# Patient Record
Sex: Female | Born: 1990 | Hispanic: No | Marital: Married | Smoking: Never smoker
Health system: Southern US, Community
[De-identification: ages and names within clinical notes are randomized; demographics above are authoritative.]

## PROBLEM LIST (undated history)

## (undated) ENCOUNTER — Inpatient Hospital Stay (HOSPITAL_COMMUNITY): Payer: Self-pay

## (undated) DIAGNOSIS — I809 Phlebitis and thrombophlebitis of unspecified site: Secondary | ICD-10-CM

## (undated) DIAGNOSIS — E282 Polycystic ovarian syndrome: Secondary | ICD-10-CM

## (undated) DIAGNOSIS — I82409 Acute embolism and thrombosis of unspecified deep veins of unspecified lower extremity: Secondary | ICD-10-CM

## (undated) DIAGNOSIS — K589 Irritable bowel syndrome without diarrhea: Secondary | ICD-10-CM

## (undated) DIAGNOSIS — D6859 Other primary thrombophilia: Secondary | ICD-10-CM

## (undated) HISTORY — PX: NO PAST SURGERIES: SHX2092

---
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
## 2014-11-08 NOTE — L&D Delivery Note (Addendum)
Delivery Note 2015: In room to assess.  Infant C/C/+2 station.  Patient aware of contractions.  Active pushing started and patient with good efforts.  Infant with some variable decelerations, during pushing, but overall reassuring.  Mother noted to have temperature of 100.1 and unasyn ordered and tylenol given.  Patient educated on shoulder dystocia maneuvers and extra personnel in room.  Dr. Carroll Sage updated and informed of potential shoulder dystocia and in hospital for delivery.  Patient delivered as below with staff and family support.    At 9:38 PM, on Sept 29, 2016, a viable female "Terri Mason" was delivered via Vaginal, Spontaneous Delivery (Presentation: Right Occiput Anterior with compound left hand and restitution to ROP).  Shoulders delivered easily and infant with good tone and spontaneous cry. Tactile stimulation given by provider and infant placed on mother's abdomen where nurse continued tactile stimulation.  Infant noted to have cord around the leg, by nurse.  However, infant APGAR: 9, 9. Cord clamped, cut, and blood collected. Placenta delivered spontaneously and noted to be intact with 3VC upon inspection.  Vaginal inspection revealed a deep 2nd degree laceration and bilateral labial lacerations.  Dr. Carroll Sage at bedside to confirm 2nd degree and repair made, by this provider, with 3-0 vicryl on a SH and CT-1 needled.  No additional anesthetic necessary and patient tolerated procedure well.  Fundus firm, at the umbilicus, and bleeding small.  Mother hemodynamically stable and infant skin to skin prior to provider exit.  Mother desire for birth control method not addressed, but she does opt to breastfeed.  Infant weight at one hour of life: 8lbs 12.7oz, 20.75in  Anesthesia: Epidural  Episiotomy: None Lacerations: 2nd degree;Perineal;Labial Suture Repair: 3.0 vicryl Est. Blood Loss (mL): 310  Mom to postpartum.  Baby to Couplet care / Skin to Skin.  EMLY, JESSICA LYNN MSN, CNM 08/06/2015,  10:50 PM

## 2015-01-03 LAB — OB RESULTS CONSOLE GC/CHLAMYDIA
Chlamydia: NEGATIVE
GC PROBE AMP, GENITAL: NEGATIVE

## 2015-01-03 LAB — OB RESULTS CONSOLE RUBELLA ANTIBODY, IGM: RUBELLA: IMMUNE

## 2015-01-03 LAB — OB RESULTS CONSOLE ABO/RH: RH Type: POSITIVE

## 2015-01-03 LAB — OB RESULTS CONSOLE HEPATITIS B SURFACE ANTIGEN: HEP B S AG: NEGATIVE

## 2015-01-03 LAB — OB RESULTS CONSOLE RPR: RPR: NONREACTIVE

## 2015-01-03 LAB — OB RESULTS CONSOLE HIV ANTIBODY (ROUTINE TESTING): HIV: NONREACTIVE

## 2015-01-03 LAB — OB RESULTS CONSOLE ANTIBODY SCREEN: ANTIBODY SCREEN: NEGATIVE

## 2015-07-09 LAB — OB RESULTS CONSOLE GBS: GBS: NEGATIVE

## 2015-08-05 ENCOUNTER — Inpatient Hospital Stay (HOSPITAL_COMMUNITY)
Admission: AD | Admit: 2015-08-05 | Discharge: 2015-08-08 | DRG: 775 | Disposition: A | Discharge status: Home / Self Care | Payer: Medicaid Other | Source: Ambulatory Visit | Attending: Obstetrics & Gynecology | Admitting: Obstetrics & Gynecology

## 2015-08-05 ENCOUNTER — Encounter (HOSPITAL_COMMUNITY): Payer: Self-pay

## 2015-08-05 DIAGNOSIS — O358XX0 Maternal care for other (suspected) fetal abnormality and damage, not applicable or unspecified: Secondary | ICD-10-CM | POA: Diagnosis present

## 2015-08-05 DIAGNOSIS — IMO0002 Reserved for concepts with insufficient information to code with codable children: Secondary | ICD-10-CM

## 2015-08-05 DIAGNOSIS — Z3A39 39 weeks gestation of pregnancy: Secondary | ICD-10-CM | POA: Diagnosis present

## 2015-08-05 DIAGNOSIS — O4292 Full-term premature rupture of membranes, unspecified as to length of time between rupture and onset of labor: Secondary | ICD-10-CM | POA: Diagnosis present

## 2015-08-05 DIAGNOSIS — O4290 Premature rupture of membranes, unspecified as to length of time between rupture and onset of labor, unspecified weeks of gestation: Secondary | ICD-10-CM | POA: Diagnosis present

## 2015-08-05 DIAGNOSIS — K589 Irritable bowel syndrome without diarrhea: Secondary | ICD-10-CM | POA: Diagnosis present

## 2015-08-05 DIAGNOSIS — O9962 Diseases of the digestive system complicating childbirth: Secondary | ICD-10-CM | POA: Diagnosis present

## 2015-08-05 LAB — CBC
HCT: 35.4 % — ABNORMAL LOW (ref 36.0–46.0)
HEMOGLOBIN: 11.9 g/dL — AB (ref 12.0–15.0)
MCH: 27.8 pg (ref 26.0–34.0)
MCHC: 33.6 g/dL (ref 30.0–36.0)
MCV: 82.7 fL (ref 78.0–100.0)
Platelets: 202 10*3/uL (ref 150–400)
RBC: 4.28 MIL/uL (ref 3.87–5.11)
RDW: 16.2 % — AB (ref 11.5–15.5)
WBC: 8.8 10*3/uL (ref 4.0–10.5)

## 2015-08-05 LAB — RAPID HIV SCREEN (HIV 1/2 AB+AG)
HIV 1/2 ANTIBODIES: NONREACTIVE
HIV-1 P24 Antigen - HIV24: NONREACTIVE

## 2015-08-05 LAB — TYPE AND SCREEN
ABO/RH(D): A POS
ANTIBODY SCREEN: NEGATIVE

## 2015-08-05 MED ORDER — OXYCODONE-ACETAMINOPHEN 5-325 MG PO TABS: 1.0000 | ORAL_TABLET | ORAL | Status: DC | PRN

## 2015-08-05 MED ORDER — LACTATED RINGERS IV SOLN
500.0000 mL | INTRAVENOUS | Status: DC | PRN
  Administered 2015-08-06 (×2): 500 mL via INTRAVENOUS

## 2015-08-05 MED ORDER — ACETAMINOPHEN 325 MG PO TABS: 650.0000 mg | ORAL_TABLET | ORAL | Status: DC | PRN

## 2015-08-05 MED ORDER — ZOLPIDEM TARTRATE 5 MG PO TABS: 5.0000 mg | ORAL_TABLET | Freq: Every evening | ORAL | Status: DC | PRN

## 2015-08-05 MED ORDER — OXYTOCIN BOLUS FROM INFUSION
500.0000 mL | INTRAVENOUS | Status: DC
  Administered 2015-08-06: 500 mL via INTRAVENOUS

## 2015-08-05 MED ORDER — FENTANYL CITRATE (PF) 100 MCG/2ML IJ SOLN: 50.0000 ug | INTRAMUSCULAR | Status: DC | PRN

## 2015-08-05 MED ORDER — LIDOCAINE HCL (PF) 1 % IJ SOLN
30.0000 mL | INTRAMUSCULAR | Status: DC | PRN
  Filled 2015-08-05: qty 30

## 2015-08-05 MED ORDER — OXYCODONE-ACETAMINOPHEN 5-325 MG PO TABS: 2.0000 | ORAL_TABLET | ORAL | Status: DC | PRN

## 2015-08-05 MED ORDER — CITRIC ACID-SODIUM CITRATE 334-500 MG/5ML PO SOLN
30.0000 mL | ORAL | Status: DC | PRN
  Administered 2015-08-06: 30 mL via ORAL
  Filled 2015-08-05: qty 15

## 2015-08-05 MED ORDER — LACTATED RINGERS IV SOLN
INTRAVENOUS | Status: DC
  Administered 2015-08-05 – 2015-08-06 (×3): via INTRAVENOUS

## 2015-08-05 MED ORDER — OXYTOCIN 40 UNITS IN LACTATED RINGERS INFUSION - SIMPLE MED: 62.5000 mL/h | INTRAVENOUS | Status: DC

## 2015-08-05 MED ORDER — ONDANSETRON HCL 4 MG/2ML IJ SOLN
4.0000 mg | Freq: Four times a day (QID) | INTRAMUSCULAR | Status: DC | PRN
  Administered 2015-08-06: 4 mg via INTRAVENOUS
  Filled 2015-08-05: qty 2

## 2015-08-05 NOTE — H&P (Signed)
Terri Mason is a 24 y.o. female, G1P0 at 39.4 weeks, presenting for SROM.  Patient states she has been leaking fluid since Saturday, but did not feel any contractions so did not seek assistance.  Patient states that signs of rupture were noted at her office visit today.  Patient desires waterbirth and is GBS negative.  Patient pregnancy significant for mild left side fetal pyelectasis.    Patient Active Problem List   Diagnosis Date Noted  . PROM (premature rupture of membranes) 08/05/2015  . Fetal pyelectasis 08/05/2015  . Irritable bowel syndrome 08/05/2015    History of present pregnancy: Patient entered care at 8.6 weeks at [facility] then transferred to [facility] at 18.4wks for WB option.   EDC of 08/08/2015 was established by 9 wk [place] on 01/03/2015.   Anatomy scan:  18.4 weeks, with normal findings and an anterior placenta.   Additional [place] evaluations:   -Anatomy: vertex, anterior placenta, no previa, Normal cord insertion seen. Normal fluid Anatomy seen=open hands, 5th digit, NB and female gender Anatomy not seen, RVOT, LVOL, AA and DA.   24wks FU: NORMAL ANATOMY US COMPLETION EXCEPT FOR LEFT PYELECTASIS 4.1 MM, 35.5wks: [place]: SIUP, VERTEX, NORMAL FLUID, GROWTH 84%, MILD PYLECTASIS ON THE LEFT Significant prenatal events:  2nd Trimester: C/O diarrhea and cramping after trip to [place].  Dx with viral syndrome by PCP.  Reports decreased fetal movement. C/O hemorrhoids that persisted through remainder of pregnancy.  3rd Trimester: C/o  Rash between breast and on upper abdomen. NST at 34.5 wks SHOWS NORMAL BASELINE, MOD. VAR, REACTIVE. TOCO: NO CONTRACTIONS.  c/o increased lower abdominal pain that causes difficulty walking and occasional sharp pains. Last evaluation:  08/05/2015 by S. Devane-Johnson, CNM.  VE Deferred.  FHR Not recorded. BP 96/78  Wt 157lbs  OB History    No data available     No past medical history on file. No past surgical history on file. Family History: family  history is not on file. Social History:  has no tobacco, alcohol, and drug history on file. Patient is married, husband name Production assistant, radio  Maternal Diabetes: No Genetic Screening: Declined Maternal Ultrasounds/Referrals: Abnormal:  Findings:   Fetal renal pyelectasis Fetal Ultrasounds or other Referrals:  None Maternal Substance Abuse:  No Significant Maternal Medications:  None Significant Maternal Lab Results: Lab values include: Group B Strep negative    ROS:  +LOF, +Ctx, +FM, -VB  Allergies not on file     Height  (1.549 m), weight 70.761 kg (156 lb).  Physical Exam  Constitutional: She is oriented to person, place, and time. She appears well-developed and well-nourished. No distress.  HENT:  Head: Normocephalic and atraumatic.  Eyes: EOM are normal. Pupils are equal, round, and reactive to light.  Neck: Normal range of motion.  Cardiovascular: Normal rate, regular rhythm and normal heart sounds.   Respiratory: Effort normal and breath sounds normal.  GI: Soft. Bowel sounds are normal.  Gravid--fundal height appears AGA, Soft, NT   Musculoskeletal: Normal range of motion. She exhibits edema (Pedial Bilateral +3).  Neurological: She is alert and oriented to person, place, and time.  Skin: Skin is warm and dry.  Red striation on lower abdomen     FHR: 145 bpm, Mod Var, -Decels, +Accels UCs:  Q2-65min, palpates mild  Prenatal labs: ABO, Rh:  A Positive Antibody:  Negative Rubella:    Immune RPR:   NR HBsAg:   Negative HIV:   Negative GBS:  Negative Sickle  cell/Hgb electrophoresis:  N/A Pap:  Normal 12/2014 GC:  Negative Chlamydia:  Negative Other:  Varicella Immune    Assessment IUP at 39.6wks Cat I FT Prolonged PROM GBS Negative Desires WB Fetal Pyelectasis  Plan: Admit to [facility] per consult with Dr. Carmela Hurt Routine Labor and Delivery Orders per [facility] Protocol In room to complete assessment and discuss  POC: -Restrictions to waterbirth including but not limited to maternal fever, pitocin augmentation, and BP -Considering potentially ruptured since Saturday, Saline lock necessary for quick access -Will consider intermittent monitoring -Will return to discuss expectant mgmt vs augmentation after cervical exam No questions or concerns   Nixon Sparr LYNNCNM, MSN 08/05/2015, 8:14 PM   Addendum 2220 SVE: 1/Th/-3 Vertex Discussed expectant mgmt vs augmentation Questions and concerns addressed Patient displays uncertainty regarding process Encouraged to consider options and ask questions as appropriate Given information regarding IV pain medication, sleep aides, and intermittent monitoring No further questions or concerns Dr. Sallyanne Havers updated  Marlene Bast MSN, CNM 08/05/2015 10:23 PM

## 2015-08-06 ENCOUNTER — Encounter (HOSPITAL_COMMUNITY): Payer: Self-pay

## 2015-08-06 ENCOUNTER — Inpatient Hospital Stay (HOSPITAL_COMMUNITY): Payer: Medicaid Other | Admitting: Anesthesiology

## 2015-08-06 LAB — ABO/RH: ABO/RH(D): A POS

## 2015-08-06 LAB — RPR: RPR Ser Ql: NONREACTIVE

## 2015-08-06 MED ORDER — ONDANSETRON HCL 4 MG/2ML IJ SOLN: 4.0000 mg | INTRAMUSCULAR | Status: DC | PRN

## 2015-08-06 MED ORDER — FENTANYL 2.5 MCG/ML BUPIVACAINE 1/10 % EPIDURAL INFUSION (WH - ANES)
14.0000 mL/h | INTRAMUSCULAR | Status: DC | PRN
  Administered 2015-08-06 (×2): 14 mL/h via EPIDURAL
  Filled 2015-08-06 (×2): qty 125

## 2015-08-06 MED ORDER — PHENYLEPHRINE 40 MCG/ML (10ML) SYRINGE FOR IV PUSH (FOR BLOOD PRESSURE SUPPORT)
80.0000 ug | PREFILLED_SYRINGE | INTRAVENOUS | Status: DC | PRN
  Filled 2015-08-06: qty 20

## 2015-08-06 MED ORDER — DIBUCAINE 1 % RE OINT
1.0000 "application " | TOPICAL_OINTMENT | RECTAL | Status: DC | PRN
  Administered 2015-08-07: 1 via RECTAL
  Filled 2015-08-06: qty 28

## 2015-08-06 MED ORDER — OXYCODONE-ACETAMINOPHEN 5-325 MG PO TABS: 2.0000 | ORAL_TABLET | ORAL | Status: DC | PRN

## 2015-08-06 MED ORDER — OXYTOCIN 40 UNITS IN LACTATED RINGERS INFUSION - SIMPLE MED
1.0000 m[IU]/min | INTRAVENOUS | Status: DC
  Filled 2015-08-06: qty 1000

## 2015-08-06 MED ORDER — IBUPROFEN 600 MG PO TABS
600.0000 mg | ORAL_TABLET | Freq: Four times a day (QID) | ORAL | Status: DC
  Administered 2015-08-07 – 2015-08-08 (×7): 600 mg via ORAL
  Filled 2015-08-06 (×7): qty 1

## 2015-08-06 MED ORDER — DIPHENHYDRAMINE HCL 25 MG PO CAPS: 25.0000 mg | ORAL_CAPSULE | Freq: Four times a day (QID) | ORAL | Status: DC | PRN

## 2015-08-06 MED ORDER — LANOLIN HYDROUS EX OINT: TOPICAL_OINTMENT | CUTANEOUS | Status: DC | PRN

## 2015-08-06 MED ORDER — ACETAMINOPHEN 325 MG PO TABS: 650.0000 mg | ORAL_TABLET | ORAL | Status: DC | PRN

## 2015-08-06 MED ORDER — LIDOCAINE HCL (PF) 1 % IJ SOLN
INTRAMUSCULAR | Status: DC | PRN
  Administered 2015-08-06 (×2): 5 mL via EPIDURAL

## 2015-08-06 MED ORDER — WITCH HAZEL-GLYCERIN EX PADS
1.0000 "application " | MEDICATED_PAD | CUTANEOUS | Status: DC | PRN
  Administered 2015-08-07: 1 via TOPICAL

## 2015-08-06 MED ORDER — PRENATAL MULTIVITAMIN CH
1.0000 | ORAL_TABLET | Freq: Every day | ORAL | Status: DC
  Administered 2015-08-07: 1 via ORAL
  Filled 2015-08-06: qty 1

## 2015-08-06 MED ORDER — ONDANSETRON HCL 4 MG PO TABS: 4.0000 mg | ORAL_TABLET | ORAL | Status: DC | PRN

## 2015-08-06 MED ORDER — TERBUTALINE SULFATE 1 MG/ML IJ SOLN
0.2500 mg | Freq: Once | INTRAMUSCULAR | Status: AC | PRN
  Administered 2015-08-06: 0.25 mg via SUBCUTANEOUS
  Filled 2015-08-06: qty 1

## 2015-08-06 MED ORDER — ACETAMINOPHEN 500 MG PO TABS
1000.0000 mg | ORAL_TABLET | Freq: Once | ORAL | Status: AC
  Administered 2015-08-06: 1000 mg via ORAL
  Filled 2015-08-06: qty 2

## 2015-08-06 MED ORDER — TETANUS-DIPHTH-ACELL PERTUSSIS 5-2.5-18.5 LF-MCG/0.5 IM SUSP: 0.5000 mL | Freq: Once | INTRAMUSCULAR | Status: DC

## 2015-08-06 MED ORDER — TERBUTALINE SULFATE 1 MG/ML IJ SOLN: 0.2500 mg | Freq: Once | INTRAMUSCULAR | Status: DC | PRN

## 2015-08-06 MED ORDER — SIMETHICONE 80 MG PO CHEW: 80.0000 mg | CHEWABLE_TABLET | ORAL | Status: DC | PRN

## 2015-08-06 MED ORDER — BENZOCAINE-MENTHOL 20-0.5 % EX AERO
1.0000 | INHALATION_SPRAY | CUTANEOUS | Status: DC | PRN
Start: 2015-08-06 — End: 2015-08-08
  Administered 2015-08-07: 1 via TOPICAL
  Filled 2015-08-06: qty 56

## 2015-08-06 MED ORDER — BUTORPHANOL TARTRATE 1 MG/ML IJ SOLN
1.0000 mg | INTRAMUSCULAR | Status: DC | PRN
  Administered 2015-08-06 (×2): 1 mg via INTRAVENOUS
  Filled 2015-08-06 (×2): qty 1

## 2015-08-06 MED ORDER — OXYCODONE-ACETAMINOPHEN 5-325 MG PO TABS: 1.0000 | ORAL_TABLET | ORAL | Status: DC | PRN

## 2015-08-06 MED ORDER — ZOLPIDEM TARTRATE 5 MG PO TABS: 5.0000 mg | ORAL_TABLET | Freq: Every evening | ORAL | Status: DC | PRN

## 2015-08-06 MED ORDER — SENNOSIDES-DOCUSATE SODIUM 8.6-50 MG PO TABS
2.0000 | ORAL_TABLET | ORAL | Status: DC
  Administered 2015-08-07 – 2015-08-08 (×2): 2 via ORAL
  Filled 2015-08-06 (×2): qty 2

## 2015-08-06 MED ORDER — DIPHENHYDRAMINE HCL 50 MG/ML IJ SOLN: 12.5000 mg | INTRAMUSCULAR | Status: DC | PRN

## 2015-08-06 MED ORDER — MISOPROSTOL 50MCG HALF TABLET
50.0000 ug | ORAL_TABLET | ORAL | Status: DC | PRN
  Administered 2015-08-06: 50 ug via ORAL
  Filled 2015-08-06: qty 0.5

## 2015-08-06 MED ORDER — EPHEDRINE 5 MG/ML INJ: 10.0000 mg | INTRAVENOUS | Status: DC | PRN

## 2015-08-06 MED ORDER — SODIUM CHLORIDE 0.9 % IV SOLN
3.0000 g | Freq: Four times a day (QID) | INTRAVENOUS | Status: DC
  Administered 2015-08-06: 3 g via INTRAVENOUS
  Filled 2015-08-06 (×4): qty 3

## 2015-08-06 NOTE — Anesthesia Preprocedure Evaluation (Signed)
Anesthesia Evaluation  Patient identified by MRN, date of birth, ID band Patient awake    Reviewed: Allergy & Precautions, NPO status , Patient's Chart, lab work & pertinent test results  Airway Mallampati: II  TM Distance: >3 FB Neck ROM: Full    Dental  (+) Teeth Intact   Pulmonary neg pulmonary ROS,    breath sounds clear to auscultation       Cardiovascular negative cardio ROS   Rhythm:Regular Rate:Normal     Neuro/Psych negative neurological ROS  negative psych ROS   GI/Hepatic negative GI ROS, Neg liver ROS,   Endo/Other  negative endocrine ROS  Renal/GU   negative genitourinary   Musculoskeletal   Abdominal   Peds negative pediatric ROS (+)  Hematology negative hematology ROS (+)   Anesthesia Other Findings   Reproductive/Obstetrics (+) Pregnancy                             Lab Results  Component Value Date   WBC 8.8 08/05/2015   HGB 11.9* 08/05/2015   HCT 35.4* 08/05/2015   MCV 82.7 08/05/2015   PLT 202 08/05/2015   No results found for: INR, PROTIME   Anesthesia Physical Anesthesia Plan  ASA: II  Anesthesia Plan: Epidural   Post-op Pain Management:    Induction:   Airway Management Planned:   Additional Equipment:   Intra-op Plan:   Post-operative Plan:   Informed Consent: I have reviewed the patients History and Physical, chart, labs and discussed the procedure including the risks, benefits and alternatives for the proposed anesthesia with the patient or authorized representative who has indicated his/her understanding and acceptance.     Plan Discussed with:   Anesthesia Plan Comments:         Anesthesia Quick Evaluation

## 2015-08-06 NOTE — Progress Notes (Addendum)
Labor Progress  Subjective: Pt comfortable with epidural.  Pt on the right side.  Foley in place draining to gravity clear yellow urine  Objective: BP 99/65 mmHg  Pulse 82  Temp(Src) 97.6 F (36.4 C) (Oral)  Resp 18  Ht  (1.549 m)  Wt 156 lb (70.761 kg)  BMI 29.49 kg/m2  SpO2 97%    FHT: 120, moderate variability, + accel, no decel CTX:  regular, every 5-6 minutes Uterus gravid, soft non tender SVE:  Dilation: 5 Effacement (%): 90 Station: -1 Exam by:: v standard cnm   Assessment:  IUP at 39.5 weeks NICHD: Category Membranes: SROM pt believes she ruptured Saturday 9/24 It was confirmed in the office on Tuesday 9/27 by Rocco Serene   Labor progress: Inadquate labor Asynclitic position  GBS: negative  Plan: Continue labor plan Continuous monitoring Rest Frequent position changes to facilitate fetal rotation and descent. Will reassess with cervical exam at 1600 or earlier if necessary Start pitocin per protocol      Venus Standard, CNM, MSN 08/06/2015. 12:44 PM

## 2015-08-06 NOTE — Progress Notes (Signed)
Labor Progress  Subjective: Comfortable, no complaints.  No urge to push  Objective: BP 118/74 mmHg  Pulse 88  Temp(Src) 97.5 F (36.4 C) (Oral)  Resp 18  Ht  (1.549 m)  Wt 156 lb (70.761 kg)  BMI 29.49 kg/m2  SpO2 97%   Total I/O In: -  Out: 500 [Urine:500] FHT: 1 CTX:  regular, every 3-5 minutes Uterus gravid, soft non tender SVE:  C/C/+1 Pitocin at 38mUn/min  Assessment:  IUP at 39.5 weeks NICHD: Category 1 Membranes:  SROM pt believes she ruptured Saturday 9/24 It was confirmed in the office on Tuesday 9/27 by Mattie Marlin CNM Labor progress: adquate labor Pitocin Augmentation GBS: negative  Plan: Continue labor plan Continuous monitoring Pt put in high fowlers to facilitate fetal descent Will reassess with cervical exam at 4:30 or earlier if necessary Continue pitocin per protocol    Venus Standard, CNM, MSN 08/06/2015. 3:40 PM

## 2015-08-06 NOTE — Progress Notes (Addendum)
Labor Progress  Subjective: Pushing started at 1610-9604, decel to 98 started at 1645.    Objective: BP 112/57 mmHg  Pulse 120  Temp(Src) 98.6 F (37 C) (Oral)  Resp 18  Ht  (1.549 m)  Wt 156 lb (70.761 kg)  BMI 29.49 kg/m2  SpO2 100%   Total I/O In: -  Out: 500 [Urine:500] FHT: 135, moderate variability, + accel, no decel since 1704. CTX:  regular, every 3-4 minutes, Uterus gravid, soft non tender SVE:  10cm at 0-+1 per Dr Molly Maduro   Assessment:  IUP at 39.5 weeks NICHD: Category 1 Category 3 with active intrauterine resuscitative measures starting at 1645-1700 Terb given per Dr Jearld Lesch Membranes: prolong with no s/s of infection Labor progress: stage 2 Pitocin DC per protocol GBS: Negative FSE and IUPC placed MVU 160    Plan: Continue labor plan Continuous monitoring Frequent position changes to facilitate fetal rotation and descent. Will reassess with cervical exam at 1830 or earlier if necessary Dr Su Hilt at the bedside.  Discussion of laboring down for 1 hour.  If no change will proceed with CS as other options were already discussed by CNM.  Pt is declining vacuum assistance.  The fetal head is too high to vacuum at this time.     Venus Standard, CNM, MSN 08/06/2015. 5:34 PM

## 2015-08-06 NOTE — Progress Notes (Signed)
Terri Mason MRN: [id number]  Subjective: -Patient resting in bed with peanut between legs.  Reports comfort and intermittent feeling of rectal pressure.  Patient expresses fear about events that took place during fetal bradycardia.  Family at bedside.  Objective: BP 125/75 mmHg  Pulse 116  Temp(Src) 98.6 F (37 C) (Oral)  Resp 20  Ht  (1.549 m)  Wt 70.761 kg (156 lb)  BMI 29.49 kg/m2  SpO2 100% I/O last 3 completed shifts: In: -  Out: 750 [Urine:750]   FHT: 135 bpm, Mod Var, -Decels, +Accels UC: Q2-40min, MVUs   SVE: Deferred Membranes: SROM  Pitocin:46mUn/min S/P Cytotec at 0230  Assessment:  IUP at 39.5wks Cat I FT  2nd Stage Labor Labor Augmentation Inadequate Contractions  Plan: -Sympathies given regarding fear during fetal bradycardia, Reassurances that infant is doing well currently -Discussed POC as implemented by Dr. Lance Morin and Dr. Carroll Sage *Allow time to labor down and for contractions to become adequate *Will reassess in one hour from last exam at 1830 per V. Standard, CNM report *If no significant change we our to will proceed with C/S *If change, but contractions inadequate will evaluate based on amount of fetal descent -Patient verbalizes understanding and agrees with plan -No questions, at current, from patient or husband -Continue other mgmt as ordered -Will update Dr. Carroll Sage accordingly  [facility], Terri Mason [facility], CNM 08/06/2015, 7:33 PM

## 2015-08-06 NOTE — Anesthesia Procedure Notes (Signed)
Epidural Patient location during procedure: OB Start time: 08/06/2015 11:00 AM End time: 08/06/2015 11:08 AM  Staffing Anesthesiologist: Shona Simpson D Performed by: anesthesiologist   Preanesthetic Checklist Completed: patient identified, site marked, surgical consent, pre-op evaluation, timeout performed, IV checked, risks and benefits discussed and monitors and equipment checked  Epidural Patient position: sitting Prep: ChloraPrep Patient monitoring: heart rate, continuous pulse ox and blood pressure Approach: midline Location: L4-L5 Injection technique: LOR saline  Needle:  Needle type: Tuohy  Needle gauge: 18 G Needle length: 9 cm and 9 Catheter type: closed end flexible Catheter size: 20 Guage Test dose: negative and 1.5% lidocaine with Epi 1:200 K  Assessment Events: blood not aspirated, injection not painful, no injection resistance, negative IV test and no paresthesia  Additional Notes LOR @ 5  Patient tolerated the insertion well without complications.Reason for block:procedure for pain

## 2015-08-06 NOTE — Progress Notes (Signed)
Labor Progress  Subjective: Pt very uncomfortable, report pain mainly in her back.  Has request IV pain medication x2.  Other pain management option reviewed, i.e., getting in the tub or an epidural. Family at the bedside for support  Objective: BP 102/66 mmHg  Pulse 82  Temp(Src) 97.8 F (36.6 C) (Oral)  Resp 18  Ht  (1.549 m)  Wt 156 lb (70.761 kg)  BMI 29.49 kg/m2     FHT: 120, moderate variability, + accel, no decels, maternal tracing with position change CTX:  regular, every 2-4 minutes Uterus gravid, soft non tender SVE:  Dilation: 4.5 Effacement (%): 90 Station: -1 Exam by:: Mason cnm   Assessment:  IUP at 39.5 weeks NICHD: Category Membranes:  SROM pt believes since last Saturday 9/24  It was confirmed in the office on Tuesday 9/27 by Mattie Marlin CNM.  No s/s of infection at this time Labor progress: adquate labor possibe Asynclitic position  GBS: negative    Plan: Continue labor plan Continuous monitoring IV Stadol q2 per request VE prior to Stadol Hands and knees or rrequent position changes to facilitate fetal rotation and descent. Will reassess with cervical exam at 1300 or earlier if necessary      Terri Mason, CNM, MSN 08/06/2015. 10:25 AM

## 2015-08-06 NOTE — Progress Notes (Addendum)
Terri Mason MRN: [id number]  Subjective: -Patient states she would like to implement augmentation process.  Reports contractions have slowed down, but she was having some scant bloody show.  Patient questions how long cervical ripening process will take.   Objective: BP 121/87 mmHg  Pulse 96  Temp(Src) 98.2 F (36.8 C) (Oral)  Resp 18  Ht  (1.549 m)  Wt 70.761 kg (156 lb)  BMI 29.49 kg/m2     FHT: 145 bpm, Mod Var, -Decels, +Accels at 2300 UC: Palpates mild SVE:   Dilation: 1 Effacement (%): Thick Station: -3 Exam by:: J.Emly, CNM  Vertex by [place] Membranes:SROM Pitocin:None Cytotec: 1st Dose at 0030  Assessment:  IUP at 39.5wks Cat I FT  Labor Augmentation GBS Negative  Plan: -Discussed r/b of cytotec including fetal distress and cramping/contractions -Informed that unsure how long process can take -Encouraged to rest -Will return at 0430 to reassess and perform cervical exam -No other questions or concerns -Continue other mgmt as ordered  EMLY, JESSICA LYNN,MSN, CNM 08/06/2015, 12:28 AM   Addendum 0100:   Nurse call states patient placed on monitor and is contracting every 3-4 minutes. Instructed to hold cytotec Will reassess at 0430 as originally planned  [facility], JESSICA LYNN

## 2015-08-07 LAB — CBC
HEMATOCRIT: 31.7 % — AB (ref 36.0–46.0)
HEMOGLOBIN: 10.4 g/dL — AB (ref 12.0–15.0)
MCH: 27.7 pg (ref 26.0–34.0)
MCHC: 32.8 g/dL (ref 30.0–36.0)
MCV: 84.3 fL (ref 78.0–100.0)
Platelets: 149 10*3/uL — ABNORMAL LOW (ref 150–400)
RBC: 3.76 MIL/uL — ABNORMAL LOW (ref 3.87–5.11)
RDW: 16.1 % — AB (ref 11.5–15.5)
WBC: 13.4 10*3/uL — ABNORMAL HIGH (ref 4.0–10.5)

## 2015-08-07 NOTE — Progress Notes (Signed)
One hour check delayed due to breastfeeding

## 2015-08-07 NOTE — Progress Notes (Signed)
Subjective: Postpartum Day 1: Vaginal delivery, 2nd degree perineal and bilateral labial lacerations Patient up ad lib, reports no syncope or dizziness. Feeding:  Breast Contraceptive plan:  Undecided  Plans outpatient circumcision.  Objective: Vital signs in last 24 hours: Temp:  [97.5 F (36.4 C)-100.1 F (37.8 C)] 98.4 F (36.9 C) (09/29 0515) Pulse Rate:  [71-122] 76 (09/29 0515) Resp:  [16-20] 18 (09/29 0515) BP: (93-134)/(49-83) 99/66 mmHg (09/29 0515) SpO2:  [96 %-100 %] 100 % (09/28 1700)  Physical Exam:  General: alert Lochia: appropriate Uterine Fundus: firm Perineum: healing well DVT Evaluation: No evidence of DVT seen on physical exam. Negative Homan's sign.   CBC Latest Ref Rng 08/07/2015 08/05/2015  WBC 4.0 - 10.5 K/uL 13.4(H) 8.8  Hemoglobin 12.0 - 15.0 g/dL 10.4(L) 11.9(L)  Hematocrit 36.0 - 46.0 % 31.7(L) 35.4(L)  Platelets 150 - 400 K/uL 149(L) 202     Assessment/Plan: Status post vaginal delivery day 1. Stable Continue current care. Plan for discharge tomorrow  Contraceptive options reviewed.    Nyra Capes 08/07/2015, 9:26 AM

## 2015-08-07 NOTE — Anesthesia Postprocedure Evaluation (Signed)
  Anesthesia Post-op Note  Patient: Chiropractor  Procedure(s) Performed: * No procedures listed *  Patient Location: Mother/Baby  Anesthesia Type:Epidural  Level of Consciousness: awake, alert  and oriented  Airway and Oxygen Therapy: Patient Spontanous Breathing  Post-op Pain: mild  Post-op Assessment: Post-op Vital signs reviewed, Patient's Cardiovascular Status Stable, Respiratory Function Stable, Patent Airway, No signs of Nausea or vomiting, Adequate PO intake, Pain level controlled and No headache              Post-op Vital Signs: Reviewed and stable  Last Vitals:  Filed Vitals:   08/07/15 0515  BP: 99/66  Pulse: 76  Temp: 36.9 C  Resp: 18    Complications: No apparent anesthesia complications

## 2015-08-07 NOTE — Discharge Summary (Signed)
  Vaginal Delivery Discharge Summary  Terri Mason  DOB:    1991-05-07 MRN:    [id number] CSN:    [id number]  Date of admission:                  08/05/15  Date of discharge:                   08/09/15  Procedures this admission:   SVB, repair of 2nd degree perineal and bilateral labial lacerationss  Date of Delivery: 08/06/15  Newborn Data:  Live born female  Birth Weight: 8 lb 12.7 oz (3990 g) APGAR: 9, 9  Home with mother. Name: Terri Mason Circumcision Plan: Outpatient  History of Present Illness:  [name] is a 24 y.o. female, G1P1001, who presents at [redacted]w[redacted]d weeks gestation. The patient has been followed at [facility]. She was admitted for onset of labor and rupture of membranes > 24 hours. Her pregnancy has been complicated by:  Patient Active Problem List   Diagnosis Date Noted  . SVD (spontaneous vaginal delivery) 08/06/2015  . Second-degree perineal laceration, with delivery 08/06/2015  . Obstetric labial laceration, delivered, current hospitalization 08/06/2015  . PROM (premature rupture of membranes) 08/05/2015  . Fetal pyelectasis 08/05/2015  . Irritable bowel syndrome 08/05/2015     Hospital Course:   Admitting Dx:  IUP at 39 5/7 weeks, prolonged ROM at term GBS Status:  Negative Delivering Clinician: Gerrit Heck, CNM Lacerations/MLE: 2nd degree perineal, bilateral labial Complications: None  Intrapartum Procedures: spontaneous vaginal delivery Postpartum Procedures: none   Additional Information:  Admitted with SROM likely > 24 hours, in early labor.  Initially desired waterbirth, then elected to proceed with epidural.  Pitocin for augmentation.    Discharge Diagnoses: Term Pregnancy-delivered, prolonged ROM  Feeding:  breast  Contraception:  no method--reports "took 4 years to get pregnant".  Issues reviewed.  Hemoglobin Results:  CBC Latest Ref Rng 08/07/2015 08/05/2015   WBC 4.0 - 10.5 K/uL 13.4(H) 8.8  Hemoglobin 12.0 - 15.0 g/dL 10.4(L) 11.9(L)  Hematocrit 36.0 - 46.0 % 31.7(L) 35.4(L)  Platelets 150 - 400 K/uL 149(L) 202     Discharge Physical Exam:   General: alert Lochia: appropriate Uterine Fundus: firm Incision: healing well DVT Evaluation: No evidence of DVT seen on physical exam. Negative Homan's sign.   Discharge Information:  Activity:           pelvic rest Diet:                routine Medications: Ibuprofen Condition:      stable Instructions:  Routine pp instructions, contraceptive options.   Discharge to: home  Follow-up Information    Follow up with [facility]. Schedule an appointment as soon as possible for a visit in 6 weeks.   Specialty:  Obstetrics and Gynecology   Why:  Call for any questions or concerns.   F/u with office regarding scheduling circumcision.   Contact information:   [address] [place] [address] [phone]       Nigel Bridgeman [facility] 08/08/2015 7:34 AM

## 2015-08-07 NOTE — Progress Notes (Signed)
UR chart review completed.  

## 2015-08-07 NOTE — Lactation Note (Signed)
This note was copied from the chart of Terri Saidy Defeo. Lactation Consultation Note: Lactation Brochure given to mother with review of baby and me book. Reviewed cue card with parents. Mother states she is active with WIC. She took all 3 WIC breastfeeding classes. Infant is 43 hours old. Mother describes that infant has not had a sustained latch. He suckles on and off with a shallow latch that pinches mothers nipple. Mother has attempt several times and states that infant is very sleepy. Mother has flat nipples. Rt nipple everts slightly more than left. Mothers rt nipple has become sore. Comfort gels given .   Informed mother of need to rouse infant with removing clothes and place infant skin to skin.  Infant placed at breast with several attempts. Infant only suckled once . Mother taught how to firm nipple with finger stimulation and a hand pump , but her nipple remains flat . Infant was fed 5-6 ml of ebm with a spoon. Attempt to latch again no sustain latch. Infant suckles on a gloved finger and has a strong suck. Infant has a high palate. Advised mother to hand express and supplement infant with a spoon every 2-3 hours. Discussed the use of a DEBP if infant doesn't feed well . Mother informed of cluster feeding. Advised to use nipple shield if unable to get infant latch. Suggested that she page for staff assistance for next feeding attempt. Mother receptive to all teaching.   Patient Name: Terri Mason [id number]'Q Date: 08/07/2015 Reason for consult: Initial assessment   Maternal Data Has patient been taught Hand Expression?: Yes Does the patient have breastfeeding experience prior to this delivery?: No  Feeding Feeding Type: Breast Fed Length of feed:  (no latch achieved, )  LATCH Score/Interventions Latch: Too sleepy or reluctant, no latch achieved, no sucking elicited. Intervention(s): Skin to skin;Teach feeding cues;Waking techniques Intervention(s): Adjust position;Assist with  latch;Breast compression  Audible Swallowing: None Intervention(s): Skin to skin;Hand expression  Type of Nipple: Flat Intervention(s): Hand pump  Comfort (Breast/Nipple): Soft / non-tender     Hold (Positioning): Assistance needed to correctly position infant at breast and maintain latch. Intervention(s): Support Pillows;Position options  LATCH Score: 4  Lactation Tools Discussed/Used Tools: Nipple Shields Nipple shield size: 20   Consult Status Consult Status: Follow-up Date: 08/08/15 Follow-up type: In-patient    Stevan Born [facility] 08/07/2015, 5:08 PM

## 2015-08-08 MED ORDER — IBUPROFEN 600 MG PO TABS: 600.0000 mg | ORAL_TABLET | Freq: Four times a day (QID) | ORAL | Status: DC | PRN

## 2015-08-08 NOTE — Lactation Note (Signed)
This note was copied from the chart of Terri Fidencia Macari. Lactation Consultation Note Follow up visit at 37 hours of age.  Mom is pumping on left breast, baby is not sustaining a latch.  Baby has been latching to right breast some.  Mom has been able to collect some colostrum and has finger fed with curve tip syringe.  Assisted with pump set up, gave mom a #21 flange for right breast to use as needed.  Encouraged mom to use preemie setting for 15 minutes and then work on hand expression to collect more EBM.  Instructed on cleaning supplies and storage of EBM.  Encouraged frequent feedings with feeding cues.  Assisted with Nipple shield application and mom is able to return demonstration, but will need continuous review.  Mom to use #20 on left breast and #16 on right as needed.  Assisted with latch in football hold on left breast with #20 nipple shield, baby sucks strong for just a minutes and then stops and sleeps.  Encouraged mom to keep trying, per mom last feeding was <2 hours ago.  Mom to call for assist as needed.    Patient Name: Terri Mason [id number] Date: 08/08/2015 Reason for consult: Follow-up assessment   Maternal Data    Feeding Feeding Type: Breast Fed Length of feed:  (few minutes baby not interested at this time)  LATCH Score/Interventions Latch: Repeated attempts needed to sustain latch, nipple held in mouth throughout feeding, stimulation needed to elicit sucking reflex. Intervention(s): Skin to skin;Teach feeding cues;Waking techniques Intervention(s): Breast compression;Breast massage;Assist with latch;Adjust position  Audible Swallowing: None  Type of Nipple: Flat  Comfort (Breast/Nipple): Soft / non-tender     Hold (Positioning): Assistance needed to correctly position infant at breast and maintain latch. Intervention(s): Breastfeeding basics reviewed;Support Pillows;Position options;Skin to skin  LATCH Score: 5  Lactation Tools Discussed/Used Nipple shield  size: 20   Consult Status Consult Status: Follow-up Date: 08/09/15 Follow-up type: In-patient    Shoptaw, Arvella Merles 08/08/2015, 11:34 AM

## 2015-08-08 NOTE — Discharge Instructions (Signed)
Postpartum Care After Vaginal Delivery °After you deliver your newborn (postpartum period), the usual stay in the hospital is 24-72 hours. If there were problems with your labor or delivery, or if you have other medical problems, you might be in the hospital longer.  °While you are in the hospital, you will receive help and instructions on how to care for yourself and your newborn during the postpartum period.  °While you are in the hospital: °· Be sure to tell your nurses if you have pain or discomfort, as well as where you feel the pain and what makes the pain worse. °· If you had an incision made near your vagina (episiotomy) or if you had some tearing during delivery, the nurses may put ice packs on your episiotomy or tear. The ice packs may help to reduce the pain and swelling. °· If you are breastfeeding, you may feel uncomfortable contractions of your uterus for a couple of weeks. This is normal. The contractions help your uterus get back to normal size. °· It is normal to have some bleeding after delivery. °· For the first 1-3 days after delivery, the flow is red and the amount may be similar to a period. °· It is common for the flow to start and stop. °· In the first few days, you may pass some small clots. Let your nurses know if you begin to pass large clots or your flow increases. °· Do not  flush blood clots down the toilet before having the nurse look at them. °· During the next 3-10 days after delivery, your flow should become more watery and pink or brown-tinged in color. °· Ten to fourteen days after delivery, your flow should be a small amount of yellowish-white discharge. °· The amount of your flow will decrease over the first few weeks after delivery. Your flow may stop in 6-8 weeks. Most women have had their flow stop by 12 weeks after delivery. °· You should change your sanitary pads frequently. °· Wash your hands thoroughly with soap and water for at least 20 seconds after changing pads, using  the toilet, or before holding or feeding your newborn. °· You should feel like you need to empty your bladder within the first 6-8 hours after delivery. °· In case you become weak, lightheaded, or faint, call your nurse before you get out of bed for the first time and before you take a shower for the first time. °· Within the first few days after delivery, your breasts may begin to feel tender and full. This is called engorgement. Breast tenderness usually goes away within 48-72 hours after engorgement occurs. You may also notice milk leaking from your breasts. If you are not breastfeeding, do not stimulate your breasts. Breast stimulation can make your breasts produce more milk. °· Spending as much time as possible with your newborn is very important. During this time, you and your newborn can feel close and get to know each other. Having your newborn stay in your room (rooming in) will help to strengthen the bond with your newborn.  It will give you time to get to know your newborn and become comfortable caring for your newborn. °· Your hormones change after delivery. Sometimes the hormone changes can temporarily cause you to feel sad or tearful. These feelings should not last more than a few days. If these feelings last longer than that, you should talk to your caregiver. °· If desired, talk to your caregiver about methods of family planning or contraception. °·   Talk to your caregiver about immunizations. Your caregiver may want you to have the following immunizations before leaving the hospital:  Tetanus, diphtheria, and pertussis (Tdap) or tetanus and diphtheria (Td) immunization. It is very important that you and your family (including grandparents) or others caring for your newborn are up-to-date with the Tdap or Td immunizations. The Tdap or Td immunization can help protect your newborn from getting ill.  Rubella immunization.  Varicella (chickenpox) immunization.  Influenza immunization. You should  receive this annual immunization if you did not receive the immunization during your pregnancy. Document Released: 08/22/2007 Document Revised: 07/19/2012 Document Reviewed: 06/21/2012 [facility] Patient Information 2015 [place], [place]. This information is not intended to replace advice given to you by your health care provider. Make sure you discuss any questions you have with your health care provider.  Contraception Choices Contraception (birth control) is the use of any methods or devices to prevent pregnancy. Below are some methods to help avoid pregnancy. HORMONAL METHODS   Contraceptive implant. This is a thin, plastic tube containing progesterone hormone. It does not contain estrogen hormone. Your health care provider inserts the tube in the inner part of the upper arm. The tube can remain in place for up to 3 years. After 3 years, the implant must be removed. The implant prevents the ovaries from releasing an egg (ovulation), thickens the cervical mucus to prevent sperm from entering the uterus, and thins the lining of the inside of the uterus.  Progesterone-only injections. These injections are given every 3 months by your health care provider to prevent pregnancy. This synthetic progesterone hormone stops the ovaries from releasing eggs. It also thickens cervical mucus and changes the uterine lining. This makes it harder for sperm to survive in the uterus.  Birth control pills. These pills contain estrogen and progesterone hormone. They work by preventing the ovaries from releasing eggs (ovulation). They also cause the cervical mucus to thicken, preventing the sperm from entering the uterus. Birth control pills are prescribed by a health care provider.Birth control pills can also be used to treat heavy periods.  Minipill. This type of birth control pill contains only the progesterone hormone. They are taken every day of each month and must be prescribed by your health care  provider.  Emergency contraception. Emergency contraceptives prevent pregnancy after unprotected sexual intercourse. This pill can be taken right after sex or up to 5 days after unprotected sex. It is most effective the sooner you take the pills after having sexual intercourse. Most emergency contraceptive pills are available without a prescription. Check with your pharmacist. Do not use emergency contraception as your only form of birth control. BARRIER METHODS   Female condom. This is a thin sheath (latex or rubber) that is worn over the penis during sexual intercourse. It can be used with spermicide to increase effectiveness.  Female condom. This is a soft, loose-fitting sheath that is put into the vagina before sexual intercourse.  Diaphragm. This is a soft, latex, dome-shaped barrier that must be fitted by a health care provider. It is inserted into the vagina, along with a spermicidal jelly. It is inserted before intercourse. The diaphragm should be left in the vagina for 6 to 8 hours after intercourse.  Cervical cap. This is a round, soft, latex or plastic cup that fits over the cervix and must be fitted by a health care provider. The cap can be left in place for up to 48 hours after intercourse.  Sponge. This is a soft, circular  piece of polyurethane foam. The sponge has spermicide in it. It is inserted into the vagina after wetting it and before sexual intercourse.  Spermicides. These are chemicals that kill or block sperm from entering the cervix and uterus. They come in the form of creams, jellies, suppositories, foam, or tablets. They do not require a prescription. They are inserted into the vagina with an applicator before having sexual intercourse. The process must be repeated every time you have sexual intercourse. INTRAUTERINE CONTRACEPTION  Intrauterine device (IUD). This is a T-shaped device that is put in a woman's uterus during a menstrual period to prevent pregnancy. There are 2  types:  Copper IUD. This type of IUD is wrapped in copper wire and is placed inside the uterus. Copper makes the uterus and fallopian tubes produce a fluid that kills sperm. It can stay in place for 10 years.  Hormone IUD. This type of IUD contains the hormone progestin (synthetic progesterone). The hormone thickens the cervical mucus and prevents sperm from entering the uterus, and it also thins the uterine lining to prevent implantation of a fertilized egg. The hormone can weaken or kill the sperm that get into the uterus. It can stay in place for 3-5 years, depending on which type of IUD is used. PERMANENT METHODS OF CONTRACEPTION  Female tubal ligation. This is when the woman's fallopian tubes are surgically sealed, tied, or blocked to prevent the egg from traveling to the uterus.  Hysteroscopic sterilization. This involves placing a small coil or insert into each fallopian tube. Your doctor uses a technique called hysteroscopy to do the procedure. The device causes scar tissue to form. This results in permanent blockage of the fallopian tubes, so the sperm cannot fertilize the egg. It takes about 3 months after the procedure for the tubes to become blocked. You must use another form of birth control for these 3 months.  Female sterilization. This is when the female has the tubes that carry sperm tied off (vasectomy).This blocks sperm from entering the vagina during sexual intercourse. After the procedure, the man can still ejaculate fluid (semen). NATURAL PLANNING METHODS  Natural family planning. This is not having sexual intercourse or using a barrier method (condom, diaphragm, cervical cap) on days the woman could become pregnant.  Calendar method. This is keeping track of the length of each menstrual cycle and identifying when you are fertile.  Ovulation method. This is avoiding sexual intercourse during ovulation.  Symptothermal method. This is avoiding sexual intercourse during ovulation,  using a thermometer and ovulation symptoms.  Post-ovulation method. This is timing sexual intercourse after you have ovulated. Regardless of which type or method of contraception you choose, it is important that you use condoms to protect against the transmission of sexually transmitted infections (STIs). Talk with your health care provider about which form of contraception is most appropriate for you. Document Released: 10/25/2005 Document Revised: 10/30/2013 Document Reviewed: 04/19/2013 [facility] Patient Information 2015 [place], [place]. This information is not intended to replace advice given to you by your health care provider. Make sure you discuss any questions you have with your health care provider.

## 2015-08-09 ENCOUNTER — Ambulatory Visit: Payer: Self-pay

## 2015-08-09 NOTE — Lactation Note (Signed)
This note was copied from the chart of Terri Berma Leamy. Lactation Consultation Note Mom had difficulty latching to breast d/t small short shaft nipples. Moms breast are filling w/transitional milk. Hand expression encouraged. Fitted mom w/#16 NS to Lt. Breast and in football hold latched baby w/o problem. Heard good swallows, say transfer of milk in NS after unlatched. Baby satisfied relaxed.  Assessed baby's suckle prior to latching, noted upper labial frenulum to bottom of top gum line. Tongue curled low when cried w/limited movement. Did note some milk leaking out of the corner of baby's mouth during suckling to breast w/NS.  Encouraged I&O, discussed positioning importance, transfering of milk, cluster feeding, engorgement, pumping, wearing nipple shields. Gave shells to wear between BF to assist in everting nipples. Reported to nursery RN to report to Dr. Manson Passey.  Mom has a plan and will call LC for follow-up appt. D/t wearing NS. Patient Name: Terri Mason WUJWJ'X Date: 08/09/2015 Reason for consult: Follow-up assessment   Maternal Data    Feeding Feeding Type: Breast Fed Length of feed: 15 min  LATCH Score/Interventions Latch: Grasps breast easily, tongue down, lips flanged, rhythmical sucking. Intervention(s): Waking techniques Intervention(s): Assist with latch;Adjust position;Breast massage;Breast compression  Audible Swallowing: Spontaneous and intermittent  Type of Nipple: Everted at rest and after stimulation (small short shaft nipples) Intervention(s): Shells;Double electric pump  Comfort (Breast/Nipple): Filling, red/small blisters or bruises, mild/mod discomfort  Problem noted: Mild/Moderate discomfort Interventions (Mild/moderate discomfort): Breast shields;Comfort gels;Hand massage;Hand expression;Post-pump  Hold (Positioning): Assistance needed to correctly position infant at breast and maintain latch. Intervention(s): Breastfeeding basics reviewed;Support  Pillows;Position options;Skin to skin  LATCH Score: 8  Lactation Tools Discussed/Used Tools: Shells;Nipple Shields;Pump;Comfort gels Nipple shield size: 16 Shell Type: Inverted Breast pump type: Double-Electric Breast Pump Pump Review: Milk Storage   Consult Status Consult Status: Complete Date: 08/09/15    Charyl Dancer 08/09/2015, 12:00 PM

## 2016-06-28 ENCOUNTER — Encounter: Payer: Self-pay | Admitting: Obstetrics & Gynecology

## 2016-10-27 ENCOUNTER — Emergency Department (HOSPITAL_BASED_OUTPATIENT_CLINIC_OR_DEPARTMENT_OTHER)
Admission: EM | Admit: 2016-10-27 | Discharge: 2016-10-27 | Disposition: A | Discharge status: Home / Self Care | Payer: 59 | Attending: Emergency Medicine | Admitting: Emergency Medicine

## 2016-10-27 ENCOUNTER — Other Ambulatory Visit: Payer: Self-pay | Admitting: *Deleted

## 2016-10-27 ENCOUNTER — Encounter (HOSPITAL_BASED_OUTPATIENT_CLINIC_OR_DEPARTMENT_OTHER): Payer: Self-pay

## 2016-10-27 DIAGNOSIS — R002 Palpitations: Secondary | ICD-10-CM

## 2016-10-27 HISTORY — DX: Acute embolism and thrombosis of unspecified deep veins of unspecified lower extremity: I82.409

## 2016-10-27 HISTORY — DX: Polycystic ovarian syndrome: E28.2

## 2016-10-27 LAB — URINALYSIS, ROUTINE W REFLEX MICROSCOPIC
Bilirubin Urine: NEGATIVE
GLUCOSE, UA: NEGATIVE mg/dL
HGB URINE DIPSTICK: NEGATIVE
KETONES UR: 15 mg/dL — AB
Leukocytes, UA: NEGATIVE
Nitrite: NEGATIVE
PROTEIN: NEGATIVE mg/dL
Specific Gravity, Urine: 1.029 (ref 1.005–1.030)
pH: 6 (ref 5.0–8.0)

## 2016-10-27 LAB — PREGNANCY, URINE: PREG TEST UR: NEGATIVE

## 2016-10-27 NOTE — ED Provider Notes (Signed)
[facility]   CSN: [id number] Arrival date & time: 10/27/16  1156     History   Chief Complaint Chief Complaint  Patient presents with  . Tachycardia    HPI Terri Mason is a 25 y.o. female.  This is patients 3rd visit to an emergency room and has had one visit to her primary doctor regarding palpitations. She has a flu like illness going on the last few days and she's had intermittent seemingly random episodes of palpitations with that as well. Her mother had a history of having add supraventricular tachycardia for which she needed an ablation So this has her worry. She gets this potations randomly. She has it she's really short of breath and then she gets very concerned and progressively worse and  she feels that she cannot breathe. Just some chest pressure during the time but no other symptoms. She does not take drugs, alcohol or tobacco. No change in her caffeine. No medication changes. She saw her doctor earlier today and set up for her to get a Holter monitor but she's not gotten it yet.      Past Medical History:  Diagnosis Date  . DVT (deep venous thrombosis) (HCC)   . PCOS (polycystic ovarian syndrome)     Patient Active Problem List   Diagnosis Date Noted  . SVD (spontaneous vaginal delivery) 08/06/2015  . Second-degree perineal laceration, with delivery 08/06/2015  . Obstetric labial laceration, delivered, current hospitalization 08/06/2015  . PROM (premature rupture of membranes) 08/05/2015  . Fetal pyelectasis 08/05/2015  . Irritable bowel syndrome 08/05/2015    History reviewed. No pertinent surgical history.  OB History    Gravida Para Term Preterm AB Living   1 1 1     1    SAB TAB Ectopic Multiple Live Births         0 1       Home Medications    Prior to Admission medications   Medication Sig Start Date End Date Taking? Authorizing Provider  rivaroxaban (XARELTO) 10 MG TABS tablet Take 10 mg by mouth daily.   Yes  Historical Provider, MD    Family History No family history on file.  Social History Social History  Substance Use Topics  . Smoking status: Never Smoker  . Smokeless tobacco: Never Used  . Alcohol use No     Allergies   Patient has no known allergies.   Review of Systems Review of Systems  All other systems reviewed and are negative.    Physical Exam Updated Vital Signs BP 103/68   Pulse 74   Temp 97.7 F (36.5 C) (Oral)   Resp 15   Ht 5\' 1"  (1.549 m)   Wt 109 lb (49.4 kg)   LMP  (LMP Unknown)   SpO2 99%   BMI 20.60 kg/m   Physical Exam  Constitutional: She appears well-developed and well-nourished.  HENT:  Head: Normocephalic and atraumatic.  Eyes: Conjunctivae and EOM are normal.  Neck: Normal range of motion.  Cardiovascular: Normal rate, normal heart sounds and intact distal pulses.  An irregular rhythm present. Exam reveals no friction rub.   No murmur heard. Sinus arrhythmia  Pulmonary/Chest: Effort normal and breath sounds normal. No stridor. No respiratory distress.  Abdominal: Soft. She exhibits no distension.  Musculoskeletal: Normal range of motion. She exhibits no edema or deformity.  Neurological: She is alert.  Skin: Skin is warm and dry.  Nursing note and vitals reviewed.    ED Treatments /  Results  Labs (all labs ordered are listed, but only abnormal results are displayed) Labs Reviewed  URINALYSIS, ROUTINE W REFLEX MICROSCOPIC - Abnormal; Notable for the following:       Result Value   Color, Urine AMBER (*)    Ketones, ur 15 (*)    All other components within normal limits  PREGNANCY, URINE    EKG  EKG Interpretation  Date/Time:  Wednesday October 27 2016 12:11:45 EST Ventricular Rate:  64 PR Interval:  114 QRS Duration: 78 QT Interval:  428 QTC Calculation: 441 R Axis:   87 Text Interpretation:  Normal sinus rhythm Normal ECG No significant change since last tracing Confirmed by [facility], Barbara CowerJASON [phone]) on 10/27/2016  12:14:10 PM Also confirmed by [facility], Leoda Smithhart [phone]), editor [place] CT, Jola BabinskiMarilyn [phone])  on 10/27/2016 1:36:47 PM       Radiology No results found.  Procedures Procedures (including critical care time)  Medications Ordered in ED Medications - No data to display   Initial Impression / Assessment and Plan / ED Course  I have reviewed the triage vital signs and the nursing notes.  Pertinent labs & imaging results that were available during my care of the patient were reviewed by me and considered in my medical decision making (see chart for details).  Clinical Course     Here with palpitations. On exam has a sinus arrhythmia on the monitor but no other obvious abnormalities. Possibly anxiety/panic attacks vs intermittent SVT, less likely vtach/vfib. Reviewed labs from yesterday, no repeat necessary. I called cardiology office and she will get a holter monitor tomorrow.   Final Clinical Impressions(s) / ED Diagnoses   Final diagnoses:  Palpitations    New Prescriptions Discharge Medication List as of 10/27/2016  2:54 PM       Marily MemosJason Zahniya Zellars, MD 10/28/16 [phone]

## 2016-10-27 NOTE — ED Notes (Signed)
Pt verbalizes understanding of f/u care

## 2016-10-27 NOTE — ED Triage Notes (Addendum)
C/o "heart racing" x 3 days[facility]-here today-pt NAD-steady gait

## 2016-10-27 NOTE — ED Notes (Signed)
ED Provider at bedside. 

## 2016-10-27 NOTE — Discharge Instructions (Signed)
Cardiology will call you with an appointment, likely tomorrow afternoon.

## 2016-10-28 ENCOUNTER — Ambulatory Visit (INDEPENDENT_AMBULATORY_CARE_PROVIDER_SITE_OTHER): Payer: PRIVATE HEALTH INSURANCE

## 2016-10-28 DIAGNOSIS — R002 Palpitations: Secondary | ICD-10-CM

## 2018-07-25 ENCOUNTER — Other Ambulatory Visit: Payer: Self-pay

## 2018-08-28 ENCOUNTER — Encounter (HOSPITAL_COMMUNITY): Payer: Self-pay

## 2018-10-17 ENCOUNTER — Encounter (HOSPITAL_COMMUNITY): Payer: Self-pay | Admitting: *Deleted

## 2018-10-18 ENCOUNTER — Ambulatory Visit (HOSPITAL_COMMUNITY)
Admission: RE | Admit: 2018-10-18 | Discharge: 2018-10-18 | Disposition: A | Discharge status: Home / Self Care | Payer: Medicaid Other | Source: Ambulatory Visit | Attending: Obstetrics and Gynecology | Admitting: Obstetrics and Gynecology

## 2018-10-18 ENCOUNTER — Encounter (HOSPITAL_COMMUNITY): Payer: Self-pay

## 2018-10-18 DIAGNOSIS — D6859 Other primary thrombophilia: Secondary | ICD-10-CM

## 2018-10-18 DIAGNOSIS — O99119 Other diseases of the blood and blood-forming organs and certain disorders involving the immune mechanism complicating pregnancy, unspecified trimester: Secondary | ICD-10-CM

## 2018-10-18 DIAGNOSIS — Z3A26 26 weeks gestation of pregnancy: Secondary | ICD-10-CM | POA: Insufficient documentation

## 2018-10-18 DIAGNOSIS — O99112 Other diseases of the blood and blood-forming organs and certain disorders involving the immune mechanism complicating pregnancy, second trimester: Secondary | ICD-10-CM | POA: Diagnosis not present

## 2018-10-18 HISTORY — DX: Phlebitis and thrombophlebitis of unspecified site: I80.9

## 2018-10-18 NOTE — Consult Note (Addendum)
30 minutes were spent in consultation.  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 48    PREVIOUS OBSTETRICAL HISTORY:  1) 2016 - NSVD @ term, female 8 lb 12 oz, complicated by PP depression 2) 2017 - First trimester spontaneous abortion without D&C     PREVIOUS GYN HISTORY:   Abnormal PAP - 2019   GC - neg   Chlamydia - neg    Syphilis - neg   CAT - 12 x irreg x 7   Contraception - none    PREVIOUS MEDICAL HISTORY:  DM - neg   HTN - neg   Asthma - neg   Thyroid - neg   Rheumatic Fever - neg    Heart - neg   Lung - neg   Liver - neg   Kidney - neg   Epilepsy - neg    TB - neg   Herpes - neg   UTI - neg   At age 46, patient developed a superficial thrombophlebitis from an IV that became infected.  This ultimately extended into the deep venous system and patient was anticoagulated.     PREVIOUS SURGICAL HISTORY:  None    MEDICATIONS:  Prenatal Vitamins, LDA 81 mg QD       ALLERGIES/REACTIONS:  Benadryl - pruritus      HABITS:  Smoking - neg   Drinking - neg   Drugs - neg    PSYCHOSOCIAL:  Married      Sexual Abuse - neg   Physical Abuse - neg   Verbal Abuse - neg      PROFESSION:  Homemaker    FAMILY HISTORY:  DM - GM   HTN - GM   Twins - neg   Stillborns -neg    Birth Defects - neg   Mental Retardation - neg   Blood Dyscrasias - neg   Anesthesia Complications - neg    Genetic - neg          PROTEIN S DEFICIENCY:  General counseling was performed regarding Protein S deficiency.  A description of Thrombophilia, potential complications in pregnancy (pregnancy wastage, possible but weak association with IUGR and increased perinatal morbidity and mortality) and outside of pregnancy (DVT, PE, stroke, MI etc.) as well as management was discussed.  The autosomal  dominant nature of this condition was described including a 50% recurrence in her progeny.  Cutoff values for free protein S antigen levels in the second and third trimesters have been identified at less than 30% and less than 24%, respectively.   Treatment:  High risk thrombophilia - Women with the following abnormalities are at high risk for maternal thromboembolic disorders. It is suggested that these women be treated with prophylactic or intermediate dose unfractionated heparin or LMWH. In the setting of personal or strong family history of VTE, we suggest treating with therapeutic dose anticoagulation (unfractionated heparin or LMWH) during pregnancy and postpartum.  Antithrombin deficiency  Homozygotes for the factor V Leiden (FVL) mutation  Homozygotes for the prothrombin gene (PGM) mutation  Compound heterozygotes for FVL and PGM  Lower risk thrombophilia - The management of women with lower risk thrombophilias, ie, heterozygotes for the factor V Leiden or prothrombin G20210A mutation, protein C or S deficiency depends upon their personal  history of thrombotic events.  Prior VTE - Women with inherited, lower risk thrombophilias who have a positive personal history of thrombotic events should receive prophylaxis during pregnancy and for four to six weeks postpartum.  No prior VTE - In contrast, asymptomatic women with inherited lower risk thrombophilias, but no personal or strong family history of VTE, do not require antepartum treatment with prophylactic heparin, but should undergo individualized risk assessment.  In the absence of other major risk factors, their risk of thromboembolism is likely less than 1 percent.  There is no strong evidence that postpartum prophylaxis is necessary in this population . The thrombotic risk in the absence of other risk factors is quite low, but prophylaxis should be initiated postpartum if there are additional risk factors for thrombosis (eg, if the  woman undergoes cesarean delivery). Prior adverse pregnancy outcome - Data from a single randomized trial showed that enoxaparin administered to women with one fetal loss and a low risk thrombophilia improved both live birth rate and birth weight. Confirmation of these data are required before we can routinely recommend prophylactic anticoagulation for all such women.  Although this option should be discussed with patients. Regardless of thrombophilia status, all women who develop venous thromboembolism (VTE) during pregnancy should receive therapeutic anticoagulation throughout their pregnancy with either unfractionated or low molecular weight heparin (LMWH) regimens described below. Anticoagulation should be continued for at least six weeks postpartum to complete a minimum of three months of therapy. These women are at very high risk for recurrent thrombosis if anticoagulation is discontinued sooner.  Thromboprophylaxis is recommended throughout pregnancy and in the postpartum period for women with idiopathic VTE in the nonpregnant state.  Pregnant women with a history of a VTE related to a nonrecurrent risk factor (eg, immobilization after a fracture) and who do not have a detectable inherited or acquired thrombophilia appear to be at low risk of recurrence (?0.5 percent)   LMWH administered by the subcutaneous route is an attractive alternative to unfractionated heparin because of its better bioavailability, longer half-life, and ease of administration.  Prophylactic doses of LMWH should be withheld at least 12 hours before regional anesthesia and therapeutic doses should be withheld at least 24 hours before regional anesthesia.  Therapeutic doses of unfractionated heparin administered over 20 weeks can produce demonstrable maternal bone loss and while less expensive, should be associated with calcium supplementation.  Therapeutic anticoagulation: Unfractionated heparin - Therapeutic heparin is  administered every 12 hours by subcutaneous injection. The dose should be adjusted to maintain the six-hour postinjection activated partial thromboplastin time (aPTT) at 1.5 to 2.5 times the mean of the control value or the patient's baseline aPTT value.  Ice applied to the proposed injection site for 20 minutes prior to the injection will help to minimize bruising. Calcium supplementation (1500 mg in divided doses daily) and weight bearing exercise (eg, walking) is recommended for women taking heparin to minimize the osteopenic effects of this drug. (See "Drugs that affect bone metabolism".) In some patients, unfractionated heparin will need to be given every eight hours to avoid an excessive volume (more than 2 mLs) of medication in the injection. LMWH - LMWH is also administered every 12 hours by subcutaneous injection. The initial dose depends upon the type of LMWH and the patient's weight (eg, enoxaparin 1 mg/kg twice a day). Although data supporting the need for laboratory monitoring are sparse, experts have suggested monitoring anti-Xa levels in pregnant women treated with therapeutic LMWH . The goal for therapeutic dose therapy  with twice daily administration of LMWH is to achieve anti-factor Xa levels of 0.6 to 1.0 U/mL four hours after injection. Prophylactic anticoagulation Unfractionated heparin - Subcutaneous heparin, administered every 12 hours to achieve a midinterval heparin level of 0.1 to 0.2 U/mL by the protamine titration assay, is one option. An acceptable alternative dose-based regimen is every 12 hour injections of 5000 to 7500 units in the first trimester, 7500 to 10,000 units in the second trimester, and 10,000 units in the third trimester (reduce if the aPTT is elevated) . LMWH - The dose depends on the specific agent (eg, dalteparin 5000 units subcutaneously once a day, enoxaparin 30 mg subcutaneously twice daily, or enoxaparin 40 mg subcutaneously once daily). Monitoring of  prophylactic LMWH therapy using anti-factor Xa levels is not required. NB - There is no consistent evidence of an association between inherited maternal thrombophilia and recurrent early first-trimester loss (at less than 10 weeks) or preeclampsia. The role of Heparin and LDA (low dose aspirin) was reviewed.  The need for serial U/S every 4 weeks for growth as well as close A-P surveillance (weekly BPP) beginning @ 32 weeks was discussed.  Estrogen containing compounds should never be given.  Heparin and LDA should be continued for 6 weeks PP.  Protein S levels may decrease as much as 40% (some authors say 60% during pregnancy and may make an absolute diagnosis not possible until confirmation at 6 weeks postpartum which should be performed.   IMPRESSIONS:  1) Protein S decrease which is c/w with pregnancy (Normal second trimester range 42-68)    RECOMMENDATIONS:  1) Repeat Protein S antigen and functional levels              minutes spent in face-to-face consultation with greater than 50% of the time spent in counseling.    Thank you for utilizing our ultrasound and consultative services.  If I may be of any further service, please do not hesitate to contact me.  Sincerely,   Patsi Searsobert L. Damyon Mullane, MD Maternal-Fetal Medicine   Copy of report sent to practitioner/clinic.

## 2018-10-21 ENCOUNTER — Inpatient Hospital Stay (HOSPITAL_BASED_OUTPATIENT_CLINIC_OR_DEPARTMENT_OTHER): Payer: Medicaid Other

## 2018-10-21 ENCOUNTER — Encounter (HOSPITAL_COMMUNITY): Payer: Self-pay

## 2018-10-21 ENCOUNTER — Inpatient Hospital Stay (HOSPITAL_COMMUNITY)
Admission: AD | Admit: 2018-10-21 | Discharge: 2018-10-21 | Disposition: A | Discharge status: Home / Self Care | Payer: Medicaid Other | Source: Ambulatory Visit | Attending: Obstetrics and Gynecology | Admitting: Obstetrics and Gynecology

## 2018-10-21 DIAGNOSIS — O9A212 Injury, poisoning and certain other consequences of external causes complicating pregnancy, second trimester: Secondary | ICD-10-CM | POA: Diagnosis not present

## 2018-10-21 DIAGNOSIS — R1084 Generalized abdominal pain: Secondary | ICD-10-CM | POA: Diagnosis not present

## 2018-10-21 DIAGNOSIS — O26899 Other specified pregnancy related conditions, unspecified trimester: Secondary | ICD-10-CM | POA: Diagnosis not present

## 2018-10-21 DIAGNOSIS — M549 Dorsalgia, unspecified: Secondary | ICD-10-CM | POA: Diagnosis present

## 2018-10-21 DIAGNOSIS — O4692 Antepartum hemorrhage, unspecified, second trimester: Secondary | ICD-10-CM

## 2018-10-21 DIAGNOSIS — Z7901 Long term (current) use of anticoagulants: Secondary | ICD-10-CM | POA: Insufficient documentation

## 2018-10-21 DIAGNOSIS — E282 Polycystic ovarian syndrome: Secondary | ICD-10-CM | POA: Insufficient documentation

## 2018-10-21 DIAGNOSIS — Z79899 Other long term (current) drug therapy: Secondary | ICD-10-CM | POA: Diagnosis not present

## 2018-10-21 DIAGNOSIS — Z3A00 Weeks of gestation of pregnancy not specified: Secondary | ICD-10-CM | POA: Diagnosis not present

## 2018-10-21 DIAGNOSIS — N939 Abnormal uterine and vaginal bleeding, unspecified: Secondary | ICD-10-CM | POA: Diagnosis present

## 2018-10-21 DIAGNOSIS — Z888 Allergy status to other drugs, medicaments and biological substances status: Secondary | ICD-10-CM | POA: Diagnosis not present

## 2018-10-21 DIAGNOSIS — O26852 Spotting complicating pregnancy, second trimester: Secondary | ICD-10-CM

## 2018-10-21 DIAGNOSIS — Z86718 Personal history of other venous thrombosis and embolism: Secondary | ICD-10-CM | POA: Insufficient documentation

## 2018-10-21 DIAGNOSIS — R109 Unspecified abdominal pain: Secondary | ICD-10-CM | POA: Diagnosis not present

## 2018-10-21 DIAGNOSIS — O26892 Other specified pregnancy related conditions, second trimester: Secondary | ICD-10-CM | POA: Diagnosis not present

## 2018-10-21 DIAGNOSIS — O209 Hemorrhage in early pregnancy, unspecified: Secondary | ICD-10-CM | POA: Insufficient documentation

## 2018-10-21 DIAGNOSIS — Z3A27 27 weeks gestation of pregnancy: Secondary | ICD-10-CM | POA: Diagnosis not present

## 2018-10-21 DIAGNOSIS — Z3689 Encounter for other specified antenatal screening: Secondary | ICD-10-CM

## 2018-10-21 HISTORY — DX: Other primary thrombophilia: D68.59

## 2018-10-21 LAB — URINALYSIS, ROUTINE W REFLEX MICROSCOPIC
GLUCOSE, UA: NEGATIVE mg/dL
Hgb urine dipstick: NEGATIVE
Ketones, ur: NEGATIVE mg/dL
LEUKOCYTES UA: NEGATIVE
Nitrite: NEGATIVE
PROTEIN: NEGATIVE mg/dL
Specific Gravity, Urine: 1.025 (ref 1.005–1.030)
pH: 6 (ref 5.0–8.0)

## 2018-10-21 LAB — WET PREP, GENITAL
CLUE CELLS WET PREP: NONE SEEN
Sperm: NONE SEEN
TRICH WET PREP: NONE SEEN
YEAST WET PREP: NONE SEEN
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
## 2018-10-21 NOTE — Discharge Instructions (Signed)
Preventing Injuries During Pregnancy Injuries can happen during pregnancy. Minor falls and accidents usually do not harm you or your baby. But some injuries can harm you and your baby. Tell your doctor about any injury you suffer. What can I do to avoid injuries? Safety  Remove rugs and loose objects on the floor.  Wear comfortable shoes that have a good grip. Do not wear shoes that have high heels.  Always wear your seat belt in the car. The lap belt should be below your belly. Always drive safely.  Do not ride on a motorcycle. Activity  Do not take part in rough and violent activities or sports.  Avoid: ? Walking on wet or slippery floors. ? Lifting heavy pots of boiling or hot liquids. ? Fixing electrical problems. ? Being near fires. General instructions  Take over-the-counter and prescription medicines only as told by your doctor.  Know your blood type and the blood type of the baby's father.  If you are a victim of domestic violence: ? Call your local emergency services (911 in the [place] ? Contact the National Domestic Violence Hotline for help and support. Get help right away if:  You fall on your belly or receive any serious blow to your belly.  You have a stiff neck or neck pain after a fall or an injury.  You get a headache or have problems with vision after an injury.  You do not feel the baby move or the baby is not moving as much as normal.  You have been a victim of domestic violence or any other kind of attack.  You have been in a car accident.  You have bleeding from your vagina.  Fluid is leaking from your vagina.  You start to have cramping or pain in your belly (contractions).  You have very bad pain in your lower back.  You feel weak or pass out (faint).  You start to throw up (vomit) after an injury.  You have been burned. Summary  Some injuries that happen during pregnancy can do harm to the baby.  Tell your doctor about any  injury.  Take steps to avoid injury. This includes removing rugs and loose objects on the floor. Always wear your seat belt in the car.  Do not take part in rough and violent activities or sports.  Get help right away if you have any serious accident or injury. This information is not intended to replace advice given to you by your health care provider. Make sure you discuss any questions you have with your health care provider. Document Released: 11/27/2010 Document Revised: 11/03/2016 Document Reviewed: 11/03/2016 Elsevier Interactive Patient Education  2017 Elsevier Inc.  
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
## 2018-10-23 LAB — GC/CHLAMYDIA PROBE AMP (~~LOC~~) NOT AT ARMC
CHLAMYDIA, DNA PROBE: POSITIVE — AB
Neisseria Gonorrhea: NEGATIVE

## 2018-10-27 ENCOUNTER — Telehealth: Payer: Self-pay | Admitting: Student

## 2018-10-27 DIAGNOSIS — A749 Chlamydial infection, unspecified: Secondary | ICD-10-CM

## 2018-10-27 MED ORDER — AZITHROMYCIN 500 MG PO TABS: 1000.0000 mg | ORAL_TABLET | Freq: Once | ORAL | 0 refills | Status: AC

## 2018-10-27 NOTE — Telephone Encounter (Addendum)
Terri Mason tested positive for  Chlamydia. Patient was called by RN and allergies and pharmacy confirmed. Rx sent to pharmacy of choice.   Judeth HornLawrence, Warrene Kapfer, NP 10/27/2018 12:59 PM       ----- Message from Kathe BectonLori S Berdik, RN sent at 10/27/2018 12:12 PM EST ----- This patient tested positive for :  chlamydia  She "is [facility] to [facility]",I have informed the patient of her results and confirmed her pharmacy is correct in her chart. Please send Rx.   Thank you,   Kathe BectonBerdik, Lori S, RN   [facility]

## 2018-11-08 NOTE — L&D Delivery Note (Signed)
Delivery Note At 2:56 PM a viable female,named Terri Mason, was delivered via Vaginal, Spontaneous (Presentation: ROA ).  APGAR: 7,8 Placenta status: spontaneous, complete with 3 Vx  Cord  Anesthesia:  Epidural Episiotomy: None Lacerations:  None Est. Blood Loss (mL):  100cc  Mom to postpartum.  Baby to Couplet care / Skin to Skin  Will plan Lovenox prophylaxis daily for 6 weeks Expected discharge home tomorrow Outpatient circumcision   Crist Fat Samariya Rockhold 01/18/2019, 3:09 PM

## 2018-11-15 ENCOUNTER — Telehealth (HOSPITAL_COMMUNITY): Payer: Self-pay | Admitting: *Deleted

## 2018-11-15 NOTE — Telephone Encounter (Signed)
Left VM for pt to call to schedule appt for UE DVT exam. EM

## 2018-11-17 ENCOUNTER — Ambulatory Visit (HOSPITAL_COMMUNITY)
Admission: RE | Admit: 2018-11-17 | Discharge: 2018-11-17 | Disposition: A | Discharge status: Home / Self Care | Payer: Medicaid Other | Source: Ambulatory Visit | Attending: Family | Admitting: Family

## 2018-11-17 ENCOUNTER — Other Ambulatory Visit (HOSPITAL_COMMUNITY): Payer: Self-pay | Admitting: Obstetrics & Gynecology

## 2018-11-17 DIAGNOSIS — I82722 Chronic embolism and thrombosis of deep veins of left upper extremity: Secondary | ICD-10-CM

## 2018-12-11 ENCOUNTER — Encounter (HOSPITAL_COMMUNITY): Payer: Self-pay

## 2018-12-13 ENCOUNTER — Encounter (HOSPITAL_COMMUNITY): Payer: Self-pay

## 2018-12-13 ENCOUNTER — Ambulatory Visit (HOSPITAL_COMMUNITY)
Admission: RE | Admit: 2018-12-13 | Discharge: 2018-12-13 | Disposition: A | Discharge status: Home / Self Care | Payer: Medicaid Other | Source: Ambulatory Visit | Attending: Obstetrics and Gynecology | Admitting: Obstetrics and Gynecology

## 2018-12-13 DIAGNOSIS — Z86718 Personal history of other venous thrombosis and embolism: Secondary | ICD-10-CM | POA: Diagnosis not present

## 2018-12-13 DIAGNOSIS — O99113 Other diseases of the blood and blood-forming organs and certain disorders involving the immune mechanism complicating pregnancy, third trimester: Secondary | ICD-10-CM | POA: Diagnosis not present

## 2018-12-13 DIAGNOSIS — Z3A34 34 weeks gestation of pregnancy: Secondary | ICD-10-CM | POA: Insufficient documentation

## 2018-12-13 DIAGNOSIS — D6859 Other primary thrombophilia: Secondary | ICD-10-CM | POA: Insufficient documentation

## 2018-12-13 HISTORY — DX: Irritable bowel syndrome without diarrhea: K58.9
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
## 2018-12-20 LAB — OB RESULTS CONSOLE GBS: STREP GROUP B AG: NEGATIVE

## 2019-01-13 ENCOUNTER — Other Ambulatory Visit: Payer: Self-pay

## 2019-01-13 ENCOUNTER — Encounter (HOSPITAL_COMMUNITY): Payer: Self-pay | Admitting: *Deleted

## 2019-01-13 ENCOUNTER — Inpatient Hospital Stay (HOSPITAL_COMMUNITY)
Admission: AD | Admit: 2019-01-13 | Discharge: 2019-01-14 | Disposition: A | Discharge status: Home / Self Care | Payer: Medicaid Other | Attending: Obstetrics and Gynecology | Admitting: Obstetrics and Gynecology

## 2019-01-13 DIAGNOSIS — Z3A39 39 weeks gestation of pregnancy: Secondary | ICD-10-CM | POA: Insufficient documentation

## 2019-01-13 DIAGNOSIS — O479 False labor, unspecified: Secondary | ICD-10-CM | POA: Insufficient documentation

## 2019-01-13 NOTE — MAU Note (Signed)
Having few ctxs last few days. Tonight ctxs are stronger. Denies LOF or bleeding. 3cm last sve

## 2019-01-14 DIAGNOSIS — O479 False labor, unspecified: Secondary | ICD-10-CM | POA: Diagnosis not present

## 2019-01-14 DIAGNOSIS — Z3A39 39 weeks gestation of pregnancy: Secondary | ICD-10-CM

## 2019-01-14 NOTE — Discharge Instructions (Signed)
Braxton Hicks Contractions Contractions of the uterus can occur throughout pregnancy, but they are not always a sign that you are in labor. You may have practice contractions called Braxton Hicks contractions. These false labor contractions are sometimes confused with true labor. What are Braxton Hicks contractions? Braxton Hicks contractions are tightening movements that occur in the muscles of the uterus before labor. Unlike true labor contractions, these contractions do not result in opening (dilation) and thinning of the cervix. Toward the end of pregnancy (32-34 weeks), Braxton Hicks contractions can happen more often and may become stronger. These contractions are sometimes difficult to tell apart from true labor because they can be very uncomfortable. You should not feel embarrassed if you go to the hospital with false labor. Sometimes, the only way to tell if you are in true labor is for your health care provider to look for changes in the cervix. The health care provider will do a physical exam and may monitor your contractions. If you are not in true labor, the exam should show that your cervix is not dilating and your water has not broken. If there are no other health problems associated with your pregnancy, it is completely safe for you to be sent home with false labor. You may continue to have Braxton Hicks contractions until you go into true labor. How to tell the difference between true labor and false labor True labor  Contractions last 30-70 seconds.  Contractions become very regular.  Discomfort is usually felt in the top of the uterus, and it spreads to the lower abdomen and low back.  Contractions do not go away with walking.  Contractions usually become more intense and increase in frequency.  The cervix dilates and gets thinner. False labor  Contractions are usually shorter and not as strong as true labor contractions.  Contractions are usually irregular.  Contractions  are often felt in the front of the lower abdomen and in the groin.  Contractions may go away when you walk around or change positions while lying down.  Contractions get weaker and are shorter-lasting as time goes on.  The cervix usually does not dilate or become thin. Follow these instructions at home:   Take over-the-counter and prescription medicines only as told by your health care provider.  Keep up with your usual exercises and follow other instructions from your health care provider.  Eat and drink lightly if you think you are going into labor.  If Braxton Hicks contractions are making you uncomfortable: ? Change your position from lying down or resting to walking, or change from walking to resting. ? Sit and rest in a tub of warm water. ? Drink enough fluid to keep your urine pale yellow. Dehydration may cause these contractions. ? Do slow and deep breathing several times an hour.  Keep all follow-up prenatal visits as told by your health care provider. This is important. Contact a health care provider if:  You have a fever.  You have continuous pain in your abdomen. Get help right away if:  Your contractions become stronger, more regular, and closer together.  You have fluid leaking or gushing from your vagina.  You pass blood-tinged mucus (bloody show).  You have bleeding from your vagina.  You have low back pain that you never had before.  You feel your baby's head pushing down and causing pelvic pressure.  Your baby is not moving inside you as much as it used to. Summary  Contractions that occur before labor are   called Braxton Hicks contractions, false labor, or practice contractions.  Braxton Hicks contractions are usually shorter, weaker, farther apart, and less regular than true labor contractions. True labor contractions usually become progressively stronger and regular, and they become more frequent.  Manage discomfort from Braxton Hicks contractions  by changing position, resting in a warm bath, drinking plenty of water, or practicing deep breathing. This information is not intended to replace advice given to you by your health care provider. Make sure you discuss any questions you have with your health care provider. Document Released: 03/10/2017 Document Revised: 08/09/2017 Document Reviewed: 03/10/2017 Elsevier Interactive Patient Education  2019 [employer]  

## 2019-01-14 NOTE — Progress Notes (Signed)
I have communicated with Gerrit Heck, cnm and reviewed vital signs:  Vitals:   01/13/19 2242 01/14/19 0028  BP: 111/81 100/62  Pulse: (!) 102 97  Resp: 18 17  Temp: 98.4 F (36.9 C) 98 F (36.7 C)    Vaginal exam:  Dilation: 3 Effacement (%): 50 Cervical Position: Posterior Station: Ballotable Presentation: Vertex Exam by:: Karl Ito, rnc,   Also reviewed contraction pattern and that non-stress test is reactive.  It has been documented that patient is contracting every 3-9 minutes with cervical change over an hour not indicating active labor.  Patient denies any other complaints.  Based on this report provider has given order for discharge.  A discharge order and diagnosis entered by a provider.   Labor discharge instructions reviewed with patient.

## 2019-01-14 NOTE — MAU Provider Note (Signed)
S: Terri Mason is a 28 year old G3P1011 at 39.1 weeks present for contractions.  Nurse requests review of strip after no cervical change.  Strip & Chart Reviewed   O:  Vitals:   01/13/19 2242  BP: 111/81  Pulse: (!) 102  Resp: 18  Temp: 98.4 F (36.9 C)  Weight: 69.9 kg  Height: 5\' 2"  (1.575 m)   No results found for this or any previous visit (from the past 24 hour(s)).  Dilation: 3 Effacement (%): 50 Cervical Position: Posterior Station: Ballotable Presentation: Vertex Exam by:: Diana Ansah-Mensah, rnc   FHR: 145 bpm, Mod Var, + Decels, + Accels UC: Q1-77min   A: IUP at 39.1 wks Cat I FT Overall Contractions  P: Strip reviewed-NST Reactive -Two prolonged decels noted after coupling contractions.  Improved without interventions.   Nurse instructed to monitor for an additional 20 minutes.  Then okay to discharge home with precautions.  Sabas Sous, MSN, CNM 12:25 AM

## 2019-01-16 ENCOUNTER — Inpatient Hospital Stay (HOSPITAL_COMMUNITY)
Admission: AD | Admit: 2019-01-16 | Discharge: 2019-01-16 | Disposition: A | Discharge status: Home / Self Care | Payer: Medicaid Other | Attending: Obstetrics & Gynecology | Admitting: Obstetrics & Gynecology

## 2019-01-16 ENCOUNTER — Encounter (HOSPITAL_COMMUNITY): Payer: Self-pay

## 2019-01-16 ENCOUNTER — Other Ambulatory Visit: Payer: Self-pay

## 2019-01-16 DIAGNOSIS — O471 False labor at or after 37 completed weeks of gestation: Secondary | ICD-10-CM | POA: Insufficient documentation

## 2019-01-16 DIAGNOSIS — Z888 Allergy status to other drugs, medicaments and biological substances status: Secondary | ICD-10-CM | POA: Diagnosis not present

## 2019-01-16 DIAGNOSIS — E282 Polycystic ovarian syndrome: Secondary | ICD-10-CM | POA: Diagnosis not present

## 2019-01-16 DIAGNOSIS — D6859 Other primary thrombophilia: Secondary | ICD-10-CM | POA: Diagnosis not present

## 2019-01-16 DIAGNOSIS — Z3493 Encounter for supervision of normal pregnancy, unspecified, third trimester: Secondary | ICD-10-CM

## 2019-01-16 DIAGNOSIS — Z7982 Long term (current) use of aspirin: Secondary | ICD-10-CM | POA: Diagnosis not present

## 2019-01-16 DIAGNOSIS — Z86718 Personal history of other venous thrombosis and embolism: Secondary | ICD-10-CM | POA: Insufficient documentation

## 2019-01-16 DIAGNOSIS — Z3A39 39 weeks gestation of pregnancy: Secondary | ICD-10-CM | POA: Diagnosis not present

## 2019-01-16 LAB — AMNISURE RUPTURE OF MEMBRANE (ROM) NOT AT ARMC: Amnisure ROM: NEGATIVE

## 2019-01-16 LAB — POCT FERN TEST: POCT Fern Test: NEGATIVE

## 2019-01-16 NOTE — MAU Note (Signed)
Gush of clear fluid around 1100, no longer leaking.  Small amt of bleeding, membranes swept yesterday.  Was 4cm.  Having irreg contractions.

## 2019-01-16 NOTE — MAU Provider Note (Signed)
Chief Complaint:  Contractions and Rupture of Membranes   First Provider Initiated Contact with Patient 01/16/19 1534     HPI: Terri Mason is a 28 y.o. G3P1011 at [redacted]w[redacted]d who presents to maternity admissions reporting a gush of clear, odorless fluid at 1100 and some intermittent contractions before and since. She was also in [place] two days ago for a labor check and her cervix is unchanged from then. She reports her membranes were swept in the office yesterday.  Denies contractions or vaginal bleeding. Good fetal movement.   Pregnancy Course:   Past Medical History:  Diagnosis Date  . DVT (deep venous thrombosis) (HCC)   . IBS (irritable bowel syndrome)   . PCOS (polycystic ovarian syndrome)   . Protein S deficiency (HCC)   . Superficial thrombophlebitis    OB History  Gravida Para Term Preterm AB Living  3 1 1   1 1   SAB TAB Ectopic Multiple Live Births  1     0 1    # Outcome Date GA Lbr Len/2nd Weight Sex Delivery Anes PTL Lv  3 Current           2 Term 08/06/15 [redacted]w[redacted]d 05:21 / 06:17 3990 g M Vag-Spont EPI  LIV  1 SAB            Past Surgical History:  Procedure Laterality Date  . NO PAST SURGERIES     History reviewed. No pertinent family history. Social History   Tobacco Use  . Smoking status: Never Smoker  . Smokeless tobacco: Never Used  Substance Use Topics  . Alcohol use: No  . Drug use: No   Allergies  Allergen Reactions  . Benadryl [Diphenhydramine] Itching and Swelling   Medications Prior to Admission  Medication Sig Dispense Refill Last Dose  . aspirin 81 MG tablet Take 81 mg by mouth daily.   01/15/2019 at Unknown time  . Prenatal Multivit-Min-Fe-FA (PRENATAL VITAMINS PO) Take by mouth.   01/15/2019 at Unknown time  . Doxylamine-Pyridoxine (BONJESTA PO) Take by mouth.   Not Taking  . rivaroxaban (XARELTO) 10 MG TABS tablet Take 10 mg by mouth daily.   Not Taking    I have reviewed patient's Past Medical Hx, Surgical Hx, Family Hx, Social Hx, medications and  allergies.   ROS:  Review of Systems  Constitutional: Negative.   HENT: Negative.   Eyes: Negative.   Respiratory: Negative.   Cardiovascular: Negative.   Gastrointestinal: Positive for abdominal pain.  Endocrine: Negative.   Genitourinary: Negative.   Musculoskeletal: Negative.   Skin: Negative.   Allergic/Immunologic: Negative.   Neurological: Negative.   Hematological: Negative.   Psychiatric/Behavioral: Negative.     Physical Exam   Patient Vitals for the past 24 hrs:  BP Temp Pulse Resp SpO2 Height Weight  01/16/19 1507 - - - - 100 % - -  01/16/19 1504 116/78 98 F (36.7 C) (!) 118 16 - - -  01/16/19 1458 - - - - - 5\' 2"  (1.575 m) 69.8 kg   Constitutional: Well-developed, well-nourished female in no acute distress.  Cardiovascular: normal rate Respiratory: normal effort GI: Abd soft, non-tender, gravid appropriate for gestational age. Pos BS x 4 MS: Extremities nontender, no edema, normal ROM Neurologic: Alert and oriented x 4.  GU: Neg CVAT.  Pelvic: NEFG, physiologic discharge, no blood, cervix clean. No CMT  Dilation: 3 Effacement (%): 50 Cervical Position: Posterior Station: -3 Presentation: Vertex Exam by:: Zenia Resides, RN   FHT:  Baseline 145 ,  moderate variability, accelerations present, decel present (see 1546) Contractions:  rare   Labs: No results found for this or any previous visit (from the past 24 hour(s)).  Imaging:  No results found.  MAU Course: Orders Placed This Encounter  Procedures  . Amnisure rupture of membrane (rom)not at [facility]    MDM: ROM: Fern negative, pooling negative. Pt ROM story is "gush" of fluid with no urine odor or color, so Amnisure sent as added measure after evacuating bloody/brown cervical mucus with fox swab from vault to avoid contamination. Amnisure also negative. ROM negative.  Fetal well being: During r/o ROM eval, fetal tracing showed marked deviation from baseline of 145-150 (with + accels, moderate  variability and no decels present). After 2-3 minute decel down to 60 in which tracing lost contact and was discontinuous, baseline remained around 120 for just under 10 minutes, then climbed steadily back up to baseline with + accels, and moderate variability and absent any decels the entire time and continues to remain so.   Assessment: Membranes Intact Uterine irritability Category 1 tracing x 3 hours  Plan: Discharge home in stable condition.  Labor precautions and fetal kick counts      Wendie Agreste 01/16/2019 4:34 PM

## 2019-01-18 ENCOUNTER — Inpatient Hospital Stay (HOSPITAL_COMMUNITY)
Admission: AD | Admit: 2019-01-18 | Discharge: 2019-01-19 | DRG: 806 | Disposition: A | Discharge status: Home / Self Care | Payer: Medicaid Other | Attending: Obstetrics and Gynecology | Admitting: Obstetrics and Gynecology

## 2019-01-18 ENCOUNTER — Inpatient Hospital Stay (HOSPITAL_COMMUNITY): Payer: Medicaid Other | Admitting: Anesthesiology

## 2019-01-18 ENCOUNTER — Encounter (HOSPITAL_COMMUNITY): Payer: Self-pay

## 2019-01-18 ENCOUNTER — Other Ambulatory Visit: Payer: Self-pay

## 2019-01-18 DIAGNOSIS — D6859 Other primary thrombophilia: Secondary | ICD-10-CM | POA: Diagnosis present

## 2019-01-18 DIAGNOSIS — O9912 Other diseases of the blood and blood-forming organs and certain disorders involving the immune mechanism complicating childbirth: Secondary | ICD-10-CM | POA: Diagnosis present

## 2019-01-18 DIAGNOSIS — I82629 Acute embolism and thrombosis of deep veins of unspecified upper extremity: Secondary | ICD-10-CM | POA: Diagnosis present

## 2019-01-18 DIAGNOSIS — O871 Deep phlebothrombosis in the puerperium: Secondary | ICD-10-CM | POA: Diagnosis present

## 2019-01-18 DIAGNOSIS — Z3A39 39 weeks gestation of pregnancy: Secondary | ICD-10-CM | POA: Diagnosis not present

## 2019-01-18 DIAGNOSIS — O26893 Other specified pregnancy related conditions, third trimester: Secondary | ICD-10-CM | POA: Diagnosis present

## 2019-01-18 LAB — CBC
HCT: 33.1 % — ABNORMAL LOW (ref 36.0–46.0)
HEMOGLOBIN: 9.8 g/dL — AB (ref 12.0–15.0)
MCH: 22.3 pg — ABNORMAL LOW (ref 26.0–34.0)
MCHC: 29.6 g/dL — ABNORMAL LOW (ref 30.0–36.0)
MCV: 75.2 fL — ABNORMAL LOW (ref 80.0–100.0)
Platelets: 249 10*3/uL (ref 150–400)
RBC: 4.4 MIL/uL (ref 3.87–5.11)
RDW: 16.4 % — ABNORMAL HIGH (ref 11.5–15.5)
WBC: 13.5 10*3/uL — AB (ref 4.0–10.5)
nRBC: 0 % (ref 0.0–0.2)

## 2019-01-18 LAB — TYPE AND SCREEN
ABO/RH(D): A POS
Antibody Screen: NEGATIVE

## 2019-01-18 LAB — ABO/RH: ABO/RH(D): A POS

## 2019-01-18 MED ORDER — SODIUM CHLORIDE 0.9% FLUSH: 3.0000 mL | Freq: Two times a day (BID) | INTRAVENOUS | Status: DC

## 2019-01-18 MED ORDER — PHENYLEPHRINE 40 MCG/ML (10ML) SYRINGE FOR IV PUSH (FOR BLOOD PRESSURE SUPPORT)
80.0000 ug | PREFILLED_SYRINGE | INTRAVENOUS | Status: DC | PRN
  Filled 2019-01-18: qty 10

## 2019-01-18 MED ORDER — DIPHENHYDRAMINE HCL 50 MG/ML IJ SOLN: 12.5000 mg | INTRAMUSCULAR | Status: DC | PRN

## 2019-01-18 MED ORDER — LIDOCAINE HCL (PF) 1 % IJ SOLN: 30.0000 mL | INTRAMUSCULAR | Status: DC | PRN

## 2019-01-18 MED ORDER — EPHEDRINE 5 MG/ML INJ: 10.0000 mg | INTRAVENOUS | Status: DC | PRN

## 2019-01-18 MED ORDER — FENTANYL-BUPIVACAINE-NACL 0.5-0.125-0.9 MG/250ML-% EP SOLN
EPIDURAL | Status: AC
  Filled 2019-01-18: qty 250

## 2019-01-18 MED ORDER — SOD CITRATE-CITRIC ACID 500-334 MG/5ML PO SOLN: 30.0000 mL | ORAL | Status: DC | PRN

## 2019-01-18 MED ORDER — IBUPROFEN 600 MG PO TABS
600.0000 mg | ORAL_TABLET | Freq: Four times a day (QID) | ORAL | Status: DC
  Administered 2019-01-18 – 2019-01-19 (×3): 600 mg via ORAL
  Filled 2019-01-18 (×4): qty 1

## 2019-01-18 MED ORDER — OXYCODONE-ACETAMINOPHEN 5-325 MG PO TABS: 1.0000 | ORAL_TABLET | ORAL | Status: DC | PRN

## 2019-01-18 MED ORDER — SENNOSIDES-DOCUSATE SODIUM 8.6-50 MG PO TABS
2.0000 | ORAL_TABLET | ORAL | Status: DC
  Administered 2019-01-18: 2 via ORAL
  Filled 2019-01-18: qty 2

## 2019-01-18 MED ORDER — FENTANYL-BUPIVACAINE-NACL 0.5-0.125-0.9 MG/250ML-% EP SOLN: 12.0000 mL/h | EPIDURAL | Status: DC | PRN

## 2019-01-18 MED ORDER — LACTATED RINGERS IV SOLN: 500.0000 mL | INTRAVENOUS | Status: DC | PRN

## 2019-01-18 MED ORDER — PRENATAL MULTIVITAMIN CH
1.0000 | ORAL_TABLET | Freq: Every day | ORAL | Status: DC
  Administered 2019-01-19: 1 via ORAL
  Filled 2019-01-18: qty 1

## 2019-01-18 MED ORDER — BENZOCAINE-MENTHOL 20-0.5 % EX AERO
1.0000 "application " | INHALATION_SPRAY | CUTANEOUS | Status: DC | PRN
  Administered 2019-01-19: 1 via TOPICAL
  Filled 2019-01-18: qty 56

## 2019-01-18 MED ORDER — ONDANSETRON HCL 4 MG PO TABS: 4.0000 mg | ORAL_TABLET | ORAL | Status: DC | PRN

## 2019-01-18 MED ORDER — LACTATED RINGERS IV SOLN
500.0000 mL | Freq: Once | INTRAVENOUS | Status: AC
  Administered 2019-01-18: 500 mL via INTRAVENOUS

## 2019-01-18 MED ORDER — SODIUM CHLORIDE 0.9% FLUSH: 3.0000 mL | INTRAVENOUS | Status: DC | PRN

## 2019-01-18 MED ORDER — FERROUS SULFATE 325 (65 FE) MG PO TABS
325.0000 mg | ORAL_TABLET | Freq: Two times a day (BID) | ORAL | Status: DC
  Administered 2019-01-19: 325 mg via ORAL
  Filled 2019-01-18: qty 1

## 2019-01-18 MED ORDER — FLEET ENEMA 7-19 GM/118ML RE ENEM: 1.0000 | ENEMA | RECTAL | Status: DC | PRN

## 2019-01-18 MED ORDER — SIMETHICONE 80 MG PO CHEW: 80.0000 mg | CHEWABLE_TABLET | ORAL | Status: DC | PRN

## 2019-01-18 MED ORDER — OXYCODONE-ACETAMINOPHEN 5-325 MG PO TABS: 2.0000 | ORAL_TABLET | ORAL | Status: DC | PRN

## 2019-01-18 MED ORDER — TETANUS-DIPHTH-ACELL PERTUSSIS 5-2.5-18.5 LF-MCG/0.5 IM SUSP: 0.5000 mL | Freq: Once | INTRAMUSCULAR | Status: DC

## 2019-01-18 MED ORDER — LIDOCAINE HCL (PF) 1 % IJ SOLN
INTRAMUSCULAR | Status: DC | PRN
  Administered 2019-01-18: 7 mL
  Administered 2019-01-18: 5 mL

## 2019-01-18 MED ORDER — OXYTOCIN BOLUS FROM INFUSION
500.0000 mL | Freq: Once | INTRAVENOUS | Status: AC
  Administered 2019-01-18: 500 mL via INTRAVENOUS

## 2019-01-18 MED ORDER — ONDANSETRON HCL 4 MG/2ML IJ SOLN: 4.0000 mg | INTRAMUSCULAR | Status: DC | PRN

## 2019-01-18 MED ORDER — LIDOCAINE HCL (PF) 1 % IJ SOLN: INTRAMUSCULAR | Status: DC | PRN

## 2019-01-18 MED ORDER — SODIUM CHLORIDE 0.9 % IV SOLN: 250.0000 mL | INTRAVENOUS | Status: DC | PRN

## 2019-01-18 MED ORDER — MEASLES, MUMPS & RUBELLA VAC IJ SOLR: 0.5000 mL | Freq: Once | INTRAMUSCULAR | Status: DC

## 2019-01-18 MED ORDER — OXYTOCIN 40 UNITS IN NORMAL SALINE INFUSION - SIMPLE MED
2.5000 [IU]/h | INTRAVENOUS | Status: DC
  Filled 2019-01-18: qty 1000

## 2019-01-18 MED ORDER — ONDANSETRON HCL 4 MG/2ML IJ SOLN: 4.0000 mg | Freq: Four times a day (QID) | INTRAMUSCULAR | Status: DC | PRN

## 2019-01-18 MED ORDER — SODIUM CHLORIDE (PF) 0.9 % IJ SOLN
INTRAMUSCULAR | Status: DC | PRN
  Administered 2019-01-18: 12 mL/h via EPIDURAL

## 2019-01-18 MED ORDER — LACTATED RINGERS IV SOLN
INTRAVENOUS | Status: DC
  Administered 2019-01-18: 14:00:00 via INTRAVENOUS

## 2019-01-18 MED ORDER — ZOLPIDEM TARTRATE 5 MG PO TABS: 5.0000 mg | ORAL_TABLET | Freq: Every evening | ORAL | Status: DC | PRN

## 2019-01-18 MED ORDER — DIBUCAINE 1 % RE OINT: 1.0000 "application " | TOPICAL_OINTMENT | RECTAL | Status: DC | PRN

## 2019-01-18 MED ORDER — PHENYLEPHRINE 40 MCG/ML (10ML) SYRINGE FOR IV PUSH (FOR BLOOD PRESSURE SUPPORT): 80.0000 ug | PREFILLED_SYRINGE | INTRAVENOUS | Status: DC | PRN

## 2019-01-18 MED ORDER — ACETAMINOPHEN 325 MG PO TABS: 650.0000 mg | ORAL_TABLET | ORAL | Status: DC | PRN

## 2019-01-18 MED ORDER — COCONUT OIL OIL
1.0000 "application " | TOPICAL_OIL | Status: DC | PRN
  Administered 2019-01-19: 1 via TOPICAL

## 2019-01-18 MED ORDER — WITCH HAZEL-GLYCERIN EX PADS: 1.0000 "application " | MEDICATED_PAD | CUTANEOUS | Status: DC | PRN

## 2019-01-18 MED ORDER — ACETAMINOPHEN 325 MG PO TABS
650.0000 mg | ORAL_TABLET | ORAL | Status: DC | PRN
  Administered 2019-01-18 – 2019-01-19 (×2): 650 mg via ORAL
  Filled 2019-01-18 (×2): qty 2

## 2019-01-18 NOTE — H&P (Signed)
.   Terri Mason is a 28 y.o. female presenting for active labor.  Pregnancy followed at [facility] since 16+4  Weeks, transferring care from [facility], and remarkable for:  1. Protein S deficiency 2. 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 Seen by MFM with recommendation of anticoagulation during pregnancy and post-partum. Patient declines both. Currently on ASA 81 mg daily 3. Partner + Hepatitis C: patient negative at NOB 4. GBS negative 5. EFW 6 lbs 14 oz at 38 weeks  OB History    Gravida  3   Para  1   Term  1   Preterm      AB  1   Living  1     SAB  1   TAB      Ectopic      Multiple  0   Live Births  1          Past Medical History:  Diagnosis Date  . DVT (deep venous thrombosis) (HCC)   . IBS (irritable bowel syndrome)   . PCOS (polycystic ovarian syndrome)   . Protein S deficiency (HCC)   . Superficial thrombophlebitis    Past Surgical History:  Procedure Laterality Date  . NO PAST SURGERIES      Family History:   MGM: HTN,diabetes,CAD  Social History:    reports that she has never smoked. She has never used smokeless tobacco. She reports that she does not drink alcohol or use drugs.   Prenatal labs: ABO, Rh:  A+ Antibody:  negative Rubella:  immune RPR:   NR HBsAg:   NR HIV:   Negative GBS: Negative (02/12 0000)    Prenatal Transfer Tool  Maternal Diabetes: No Genetic Screening: Declined Maternal Ultrasounds/Referrals: Normal Fetal Ultrasounds or other Referrals:  Referred to Materal Fetal Medicine  Maternal Substance Abuse:  No Significant Maternal Medications:  None Significant Maternal Lab Results: None   Dilation: 5.5 Effacement (%): 80 Station: -2 Exam by:: K.Wilson,RN Blood pressure 111/80, pulse (!) 113, temperature 98.5 F (36.9 C), resp. rate 18, height 5\' 2"  (1.575 m), weight 71.2 kg, last menstrual period 04/07/2018, unknown  if currently breastfeeding.  General Appearance: Alert, appropriate appearance for age. No acute distress HEENT Exam: Grossly normal Chest/Respiratory Exam: Normal chest wall and respirations. Clear to auscultation  Cardiovascular Exam: Regular rate and rhythm. S1, S2, no murmur Gastrointestinal Exam: soft, non-tender, Uterus gravid with size compatible with GA, Vertex presentation by Leopold's maneuvers Psychiatric Exam: Alert and oriented, appropriate affect  ++++++++++++++++++++++++++++++++++++++++++++++++++++++++++++++++  Vaginal exam: C/100/+2 SROM at the time of epidural with clear fluid  Fetal tracings: mild variables  ++++++++++++++++++++++++++++++++++++++++++++++++++++++++++++++++   Assessment/Plan:  Imminent delivery expected   Silverio Lay MD 01/18/2019, 1:49 PM

## 2019-01-18 NOTE — MAU Note (Signed)
Pt c/o ctx since 6:30 am. Denies any leaking reports dark brown discharge from her membranes being swept. Good fetal movement reported.

## 2019-01-18 NOTE — Anesthesia Preprocedure Evaluation (Signed)
Anesthesia Evaluation  Patient identified by MRN, date of birth, ID band Patient awake    Reviewed: Allergy & Precautions, H&P , NPO status , Patient's Chart, lab work & pertinent test results  Airway Mallampati: I  TM Distance: >3 FB Neck ROM: full    Dental no notable dental hx. (+) Teeth Intact   Pulmonary neg pulmonary ROS,    Pulmonary exam normal breath sounds clear to auscultation       Cardiovascular negative cardio ROS Normal cardiovascular exam Rhythm:regular Rate:Normal     Neuro/Psych negative neurological ROS  negative psych ROS   GI/Hepatic negative GI ROS, Neg liver ROS,   Endo/Other  negative endocrine ROS  Renal/GU   negative genitourinary   Musculoskeletal   Abdominal Normal abdominal exam  (+)   Peds  Hematology  (+) Blood dyscrasia, anemia ,   Anesthesia Other Findings   Reproductive/Obstetrics (+) Pregnancy                             Anesthesia Physical Anesthesia Plan  ASA: II  Anesthesia Plan: Epidural   Post-op Pain Management:    Induction:   PONV Risk Score and Plan:   Airway Management Planned:   Additional Equipment:   Intra-op Plan:   Post-operative Plan:   Informed Consent: I have reviewed the patients History and Physical, chart, labs and discussed the procedure including the risks, benefits and alternatives for the proposed anesthesia with the patient or authorized representative who has indicated his/her understanding and acceptance.       Plan Discussed with:   Anesthesia Plan Comments:         Anesthesia Quick Evaluation

## 2019-01-18 NOTE — Anesthesia Procedure Notes (Signed)
Epidural Patient location during procedure: OB Start time: 01/18/2019 2:19 PM End time: 01/18/2019 2:23 PM  Staffing Anesthesiologist: Leilani Able, MD Performed: anesthesiologist   Preanesthetic Checklist Completed: patient identified, site marked, surgical consent, pre-op evaluation, timeout performed, IV checked, risks and benefits discussed and monitors and equipment checked  Epidural Patient position: sitting Prep: site prepped and draped and DuraPrep Patient monitoring: continuous pulse ox and blood pressure Approach: midline Location: L3-L4 Injection technique: LOR air  Needle:  Needle type: Tuohy  Needle gauge: 17 G Needle length: 9 cm and 9 Needle insertion depth: 5 cm Catheter type: closed end flexible Catheter size: 19 Gauge Catheter at skin depth: 10 cm Test dose: negative and Other  Assessment Sensory level: T9 Events: blood not aspirated, injection not painful, no injection resistance, negative IV test and no paresthesia  Additional Notes Reason for block:procedure for pain

## 2019-01-19 LAB — CBC
HCT: 29.9 % — ABNORMAL LOW (ref 36.0–46.0)
Hemoglobin: 8.8 g/dL — ABNORMAL LOW (ref 12.0–15.0)
MCH: 22.1 pg — ABNORMAL LOW (ref 26.0–34.0)
MCHC: 29.4 g/dL — ABNORMAL LOW (ref 30.0–36.0)
MCV: 75.1 fL — ABNORMAL LOW (ref 80.0–100.0)
Platelets: 205 10*3/uL (ref 150–400)
RBC: 3.98 MIL/uL (ref 3.87–5.11)
RDW: 16.5 % — ABNORMAL HIGH (ref 11.5–15.5)
WBC: 11.1 10*3/uL — ABNORMAL HIGH (ref 4.0–10.5)
nRBC: 0 % (ref 0.0–0.2)

## 2019-01-19 LAB — RPR, QUANT+TP ABS (REFLEX)
Rapid Plasma Reagin, Quant: 1:1 {titer} — ABNORMAL HIGH
T Pallidum Abs: NONREACTIVE

## 2019-01-19 LAB — CREATININE, SERUM
Creatinine, Ser: 0.63 mg/dL (ref 0.44–1.00)
GFR calc Af Amer: >60 mL/min (ref >60)
GFR calc non Af Amer: >60 mL/min (ref >60)

## 2019-01-19 LAB — RPR: RPR Ser Ql: REACTIVE — AB

## 2019-01-19 MED ORDER — ENOXAPARIN SODIUM 40 MG/0.4ML ~~LOC~~ SOLN
40.0000 mg | SUBCUTANEOUS | Status: DC
  Administered 2019-01-19: 40 mg via SUBCUTANEOUS
  Filled 2019-01-19: qty 0.4

## 2019-01-19 MED ORDER — ENOXAPARIN SODIUM 40 MG/0.4ML ~~LOC~~ SOLN: 40.0000 mg | SUBCUTANEOUS | 0 refills | Status: AC

## 2019-01-19 MED ORDER — FERROUS SULFATE 325 (65 FE) MG PO TABS: 325.0000 mg | ORAL_TABLET | Freq: Two times a day (BID) | ORAL | 1 refills | Status: AC

## 2019-01-19 MED ORDER — IBUPROFEN 600 MG PO TABS: 600.0000 mg | ORAL_TABLET | Freq: Four times a day (QID) | ORAL | 0 refills | Status: AC

## 2019-01-19 NOTE — Lactation Note (Signed)
This note was copied from a baby's chart. Lactation Consultation Note  Patient Name: Terri Mason CHEKB'T Date: 01/19/2019 Reason for consult: Follow-up assessment   P2, Baby 89 hours old and latched upon entering with intermittent swallows.  Hx PCOS. Encouraged breast compression to keep baby active and breastfeed on both breasts per sessions. Feed on demand approximately 8-12 times per day.   Mother has DEBP set up in her room but has not been pumping. Suggest doing some pumping to increase her milk supply at home.  Discussed how to convert pump kit to pump at home. Reviewed engorgement care and monitoring voids/stools. Mother denies questions or concerns.    Maternal Data    Feeding Feeding Type: Breast Fed  LATCH Score Latch: Grasps breast easily, tongue down, lips flanged, rhythmical sucking.(latched upon entering)  Audible Swallowing: A few with stimulation  Type of Nipple: Everted at rest and after stimulation  Comfort (Breast/Nipple): Soft / non-tender  Hold (Positioning): Assistance needed to correctly position infant at breast and maintain latch.  LATCH Score: 8  Interventions Interventions: Breast feeding basics reviewed  Lactation Tools Discussed/Used     Consult Status Consult Status: Complete Date: 01/19/19    Vivianne Master [facility] 01/19/2019, 11:47 AM

## 2019-01-19 NOTE — Anesthesia Postprocedure Evaluation (Signed)
Anesthesia Post Note  Patient: Chiropractor  Procedure(s) Performed: AN AD HOC LABOR EPIDURAL     Patient location during evaluation: Mother Baby Anesthesia Type: Epidural Level of consciousness: awake and alert Pain management: pain level controlled Vital Signs Assessment: post-procedure vital signs reviewed and stable Respiratory status: spontaneous breathing, nonlabored ventilation and respiratory function stable Cardiovascular status: stable Postop Assessment: no headache, no backache, epidural receding, no apparent nausea or vomiting, patient able to bend at knees, able to ambulate and adequate PO intake Anesthetic complications: no    Last Vitals:  Vitals:   01/19/19 0302 01/19/19 0627  BP: 106/73 104/69  Pulse: 82 85  Resp: 16 14  Temp: 36.7 C 36.9 C  SpO2: 100% 100%    Last Pain:  Vitals:   01/19/19 0627  TempSrc: Oral  PainSc: 0-No pain   Pain Goal: Patients Stated Pain Goal: 9 (01/18/19 1400)                 Florina Glas Hristova

## 2019-01-19 NOTE — Lactation Note (Signed)
This note was copied from a baby's chart. Lactation Consultation Note Baby 9 hrs old. Mom stated that she has trouble getting baby to latch, once latches BF well. Mom BF her now 46 1/28 yr old son for 2 weeks until she got Mastitis. Her milk supply dropped down to pumping 1 full bottle a day no matter how much she pumped. Mom pumped and gave baby BM 1 yr 1 bottle at night feeding. Mom wore shells some and NS w/her son.  Mom has short shaft nipples. Compressible at this time. Shells given and hand pump to pre-pump. Mom has PCOS. Discussed using DEBP 5-6 times a day for stimulation and Hx. Of low milk supply. Mom in agreement.  Mom states can't use coconut oil d/t makes her break out. Newborn behavior, feeding habits, STS, I&O, supply and demand reviewed. Encouraged mom to call for assistance or questions. Lactation brochure at bedside.  Patient Name: Terri Mason [id number] Date: 01/19/2019 Reason for consult: Initial assessment;Maternal endocrine disorder Type of Endocrine Disorder?: PCOS   Maternal Data Has patient been taught Hand Expression?: Yes Does the patient have breastfeeding experience prior to this delivery?: Yes  Feeding Feeding Type: Breast Fed  LATCH Score       Type of Nipple: Everted at rest and after stimulation(short shaft)  Comfort (Breast/Nipple): Soft / non-tender        Interventions Interventions: Breast feeding basics reviewed;DEBP;Position options;Breast massage;Hand express;Pre-pump if needed;Shells;Breast compression;Hand pump  Lactation Tools Discussed/Used Tools: Shells;Pump Shell Type: Inverted Breast pump type: Double-Electric Breast Pump;Manual WIC Program: No Pump Review: Setup, frequency, and cleaning;Milk Storage Initiated by:: Peri Jefferson RN IBCLC Date initiated:: 01/19/19   Consult Status Consult Status: Follow-up Date: 01/19/19(for latch assist if needed) Follow-up type: In-patient    Charyl Dancer 01/19/2019, 12:38  AM

## 2019-01-19 NOTE — Discharge Summary (Signed)
OB Discharge Summary     Patient Name: Terri Mason DOB: 06-26-1991 MRN: [id number]  Date of admission: 01/18/2019 Delivering MD: Silverio Lay   Date of discharge: 01/19/2019  Admitting diagnosis: 39.5WKS CTX Intrauterine pregnancy: [redacted]w[redacted]d     Secondary diagnosis:  Active Problems:   Normal labor  Additional problems: protein S deficiency Chronic DVT upper extremity     Discharge diagnosis: Term Pregnancy Delivered                                                                                                Post partum procedures:Lovenox for 6 weeks  Augmentation: none  Complications: None  Hospital course:  Onset of Labor With Vaginal Delivery     28 y.o. yo [id number] at [redacted]w[redacted]d was admitted in Active Labor on 01/18/2019. Patient had an uncomplicated labor course as follows:  Membrane Rupture Time/Date: 2:04 PM ,01/18/2019   Intrapartum Procedures: Episiotomy: None [1]                                         Lacerations:  None [1]  Patient had a delivery of a Viable infant. 01/18/2019  Information for the patient's newborn:  Kellen, [place] [[id number]]       Pateint had an uncomplicated postpartum course.  She is ambulating, tolerating a regular diet, passing flatus, and urinating well. Patient is discharged home in stable condition on 01/19/19.   Physical exam  Vitals:   01/18/19 2225 01/19/19 0302 01/19/19 0627 01/19/19 0900  BP: 117/74 106/73 104/69   Pulse: 88 82 85   Resp: 16 16 14    Temp: 98.4 F (36.9 C) 98.1 F (36.7 C) 98.5 F (36.9 C)   TempSrc: Oral Oral Oral   SpO2: 99% 100% 100% 100%  Weight:      Height:       General: alert, cooperative and no distress Lochia: appropriate Uterine Fundus: firm Incision: N/A DVT Evaluation: chronic DVT in upper extremity Labs: Lab Results  Component Value Date   WBC 11.1 (H) 01/19/2019   HGB 8.8 (L) 01/19/2019   HCT 29.9 (L) 01/19/2019   MCV 75.1 (L) 01/19/2019   PLT 205 01/19/2019   No flowsheet data  found.  Discharge instruction: per After Visit Summary and "Baby and Me Booklet".  After visit meds:  Allergies as of 01/19/2019      Reactions   Benadryl [diphenhydramine] Itching, Swelling      Medication List    STOP taking these medications   aspirin 81 MG tablet     TAKE these medications   enoxaparin 40 MG/0.4ML injection Commonly known as:  LOVENOX Inject 0.4 mLs (40 mg total) into the skin daily. Start taking on:  January 20, 2019   ibuprofen 600 MG tablet Commonly known as:  ADVIL,MOTRIN Take 1 tablet (600 mg total) by mouth every 6 (six) hours.   loratadine 10 MG tablet Commonly known as:  CLARITIN Take 10 mg by mouth daily.   PRENATAL VITAMINS PO Take by  mouth.      Iron BID Diet: routine diet  Activity: Advance as tolerated. Pelvic rest for 6 weeks.   Outpatient follow up:6 weeks Follow up Appt:No future appointments. Follow up Visit:No follow-ups on file.  Postpartum contraception: Undecided  Newborn Data: Live born female  Birth Weight: 6 lb 15.1 oz (3150 g) APGAR: 8, 9  Newborn Delivery   Birth date/time:  01/18/2019 14:56:00 Delivery type:  Vaginal, Spontaneous   Desires outpatient circumcision  Baby Feeding: Breast Disposition:home with mother   01/19/2019 Kenney Houseman, CNM
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
## 2022-09-12 ENCOUNTER — Emergency Department (HOSPITAL_COMMUNITY)
Admission: EM | Admit: 2022-09-12 | Discharge: 2022-09-12 | Discharge status: Against Medical Advice | Payer: Medicaid Other

## 2022-09-12 NOTE — ED Notes (Signed)
Patient left on own accord °
